# Patient Record
Sex: Female | Born: 1960 | Race: White | Hispanic: No | Marital: Married | Smoking: Former smoker
Health system: Southern US, Community
[De-identification: ages and names within clinical notes are randomized; demographics above are authoritative.]

## PROBLEM LIST (undated history)

## (undated) DIAGNOSIS — R519 Headache, unspecified: Secondary | ICD-10-CM

## (undated) DIAGNOSIS — R51 Headache: Secondary | ICD-10-CM

## (undated) HISTORY — DX: Headache, unspecified: R51.9

## (undated) HISTORY — DX: Headache: R51

---

## 2006-01-15 HISTORY — PX: TUBAL LIGATION: SHX77

## 2009-01-15 HISTORY — PX: THYROIDECTOMY, PARTIAL: SHX18

## 2012-05-21 ENCOUNTER — Encounter: Payer: Self-pay | Admitting: Sports Medicine

## 2012-05-21 ENCOUNTER — Ambulatory Visit (INDEPENDENT_AMBULATORY_CARE_PROVIDER_SITE_OTHER): Payer: BC Managed Care – PPO | Admitting: Sports Medicine

## 2012-05-21 VITALS — BP 110/76 | HR 82 | Ht 62.0 in | Wt 118.0 lb

## 2012-05-21 DIAGNOSIS — M25372 Other instability, left ankle: Secondary | ICD-10-CM

## 2012-05-21 DIAGNOSIS — M24873 Other specific joint derangements of unspecified ankle, not elsewhere classified: Secondary | ICD-10-CM
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
## 2013-07-13 ENCOUNTER — Encounter: Payer: Self-pay | Admitting: Sports Medicine

## 2013-07-13 ENCOUNTER — Ambulatory Visit (INDEPENDENT_AMBULATORY_CARE_PROVIDER_SITE_OTHER): Payer: BC Managed Care – PPO | Admitting: Sports Medicine

## 2013-07-13 VITALS — BP 124/80 | Ht 62.0 in | Wt 118.0 lb

## 2013-07-13 DIAGNOSIS — M7502 Adhesive capsulitis of left shoulder: Secondary | ICD-10-CM | POA: Insufficient documentation

## 2013-07-13 DIAGNOSIS — M7500 Adhesive capsulitis of unspecified shoulder: Secondary | ICD-10-CM

## 2013-07-13 MED ORDER — MELOXICAM 15 MG PO TABS: 15.0000 mg | ORAL_TABLET | Freq: Every day | ORAL | Status: DC

## 2013-07-13 NOTE — Assessment & Plan Note (Signed)
Patient with limited external rotation on exam consistent with adhesive capsulitis. We will send patient to physical therapy and place on course of Mobic (at least one week trial). She will return in 3 weeks for ultrasound of left shoulder. Patient also likely with rotator cuff tendinitis which will be helped by the above. She has no signs of significant tear and adhesive capsulitis is primary concern.

## 2013-07-13 NOTE — Progress Notes (Signed)
   Subjective:   Beverly Weeks is a 53 y.o. year old very pleasant female patient who presents with the following:  Left shoulder pain Pain x 4 months gradually worsening. Also describes a stiffness and inability to move arm such as behind her back. Wakes her up at night. Aching pain over lateral shoulder. Denies history of injury. Does not do a large amount of overhead work but when she does this it aggravates her. She has tried some gentle stretching but no medicines or ice at this point. States has difficulty with bra and has to ask husband for help.  ROS- No neck pain. No paresthesias. No radiation of pain. No fever/chills. No redness over shoulder.   Past Medical History-Nonsmoker, previously healthy, no kidney issues Medications- prn laxative   Objective: BP 124/80  Ht 5\' 2"  (1.575 m)  Wt 118 lb (53.524 kg)  BMI 21.58 kg/m2 Gen: NAD, resting comfortably on table  Ext: no edema Skin: warm, dry, no rash  Neuro: intact distal sensation  Left Shoulder: Inspection reveals no abnormalities, atrophy or asymmetry. Palpation is normal with no tenderness over AC joint or bicipital groove. ROM is full in all planes passively with exception of external rotation limited to 40 degrees as compared to 70 degrees on right. Actively patient can only get to 90 degrees abduction.  Rotator cuff strength normal throughout. Pain on empty can, Neer, and Hawkins.  Painful arc noted. No drop arm sign.    Assessment/Plan:  Adhesive capsulitis of left shoulder Patient with limited external rotation on exam consistent with adhesive capsulitis. We will send patient to physical therapy and place on course of Mobic (at least one week trial). She will return in 3 weeks for ultrasound of left shoulder. Patient also likely with rotator cuff tendinitis which will be helped by the above. She has no signs of significant tear and adhesive capsulitis is primary concern.    Meds ordered this encounter    Medications  . meloxicam (MOBIC) 15 MG tablet    Sig: Take 1 tablet (15 mg total) by mouth daily. For 1 week take daily with food, then as needed    Dispense:  30 tablet    Refill:  0    Generic.

## 2013-07-13 NOTE — Patient Instructions (Signed)
Adhesive Capsulitis Sometimes the shoulder becomes stiff and is painful to move. Some people say it feels as if the shoulder is frozen in place. Because of this, the condition is called "frozen shoulder." Its medical name is adhesive capsulitis.  The shoulder joint is made up of strong connective tissue that attaches the ball of the humerus to the shallow shoulder socket. This strong connective tissue is called the joint capsule. This tissue can become stiff and swollen. That is when adhesive capsulitis sets in. CAUSES  It is not always clear just what the cause adhesive capsulitis. Possibilities include:  Injury to the shoulder joint.  Strain. This is a repetitive injury brought about by overuse.  Lack of use. Perhaps your arm or hand was otherwise injured. It might have been in a sling for awhile. Or perhaps you were not using it to avoid pain.  Referred pain. This is a sort of trick the body plays. You feel pain in the shoulder. But, the pain actually comes from an injury somewhere else in the body.  Long-standing health problems. Several diseases can cause adhesive capsulitis. They include diabetes, heart disease, stroke, thyroid problems, rheumatoid arthritis and lung disease.  Being a women older than 53. Anyone can develop adhesive capsulitis but it is most common in women in this age group. SYMPTOMS   Pain.  It occurs when the arm is moved.  Parts of the shoulder might hurt if they are touched.  Pain is worse at night or when resting.  Soreness. It might not be strong enough to be called pain. But, the shoulder aches.  The shoulder does not move freely.  Muscle spasms.  Trouble sleeping because of shoulder ache or pain. DIAGNOSIS  To decide if you have adhesive capsulitis, your healthcare provider will probably:  Ask about symptoms you have noticed.  Ask about your history of joint pain and anything that might have caused the pain.  Ask about your overall  health.  Use hands to feel your shoulder and neck.  Ask you to move your shoulder in specific directions. This may indicate the origin of the pain.  Order imaging tests; pictures of the shoulder. They help pinpoint the source of the problem. An X-ray might be used. For more detail, an MRI is often used. An MRI details the tendons, muscles and ligaments as well as the joint. TREATMENT  Adhesive capsulitis can be treated several ways. Most treatments can be done in a clinic or in your healthcare provider's office. Be sure to discuss the different options with your caregiver. They include:  Physical therapy. You will work on specific exercises to get your shoulder moving again. The exercises usually involve stretching. A physical therapist (a caregiver with special training) can show you what to do and what not to do. The exercises will need to be done daily.  Medication.  Over-the-counter medicines may relieve pain and inflammation (the body's way of reacting to injury or infection).  Corticosteroids. These are stronger drugs to reduce pain and inflammation. They are given by injection (shots) into the shoulder joint. Frequent treatment is not recommended.  Muscle relaxants. Medication may be prescribed to ease muscle spasms.  Treatment of underlying conditions. This means treating another condition that is causing your shoulder problem. This might be a rotator cuff (tendon) problem  Shoulder manipulation. The shoulder will be moved by your healthcare provider. You would be under general anesthesia (given a drug that puts you to sleep). You would not feel anything. Sometimes  the joint will be injected with salt water (saline) at high pressure to break down internal scarring in the joint capsule.  Surgery. This is rarely needed. It may be suggested in advanced cases after all other treatment has failed. PROGNOSIS  In time, most people recover from adhesive capsulitis. Sometimes, however, the  pain goes away but full movement of the shoulder does not return.  HOME CARE INSTRUCTIONS   Take any pain medications recommended by your healthcare provider. Follow the directions carefully.  If you have physical therapy, follow through with the therapist's suggestions. Be sure you understand the exercises you will be doing. You should understand:  How often the exercises should be done.  How many times each exercise should be repeated.  How long they should be done.  What other activities you should do, or not do.  That you should warm up before doing any exercise. Just 5 to 10 minutes will help. Small, gentle movements should get your shoulder ready for more.  Avoid high-demand exercise that involves your shoulder such as throwing. This type of exercise can make pain worse.  Consider using cold packs. Cold may ease swelling and pain. Ask your healthcare provider if a cold pack might help you. If so, get directions on how and when to use them. SEEK MEDICAL CARE IF:   You have any questions about your medications.  Your pain continues to increase. Document Released: 10/29/2008 Document Revised: 03/26/2011 Document Reviewed: 10/29/2008 [facility] Patient Information 2015 [place], [place]. This information is not intended to replace advice given to you by your health care provider. Make sure you discuss any questions you have with your health care provider.  Rotator Cuff Tendinitis  Rotator cuff tendinitis is inflammation of the tough, cord-like bands that connect muscle to bone (tendons) in your rotator cuff. Your rotator cuff is the collection of all the muscles and tendons that connect your arm to your shoulder. Your rotator cuff holds the head of your upper arm bone (humerus) in the cup (fossa) of your shoulder blade (scapula). CAUSES Rotator cuff tendinitis is usually caused by overusing the joint involved.  SIGNS AND SYMPTOMS  Deep ache in the shoulder also felt on the outside  upper arm over the shoulder muscle.  Point tenderness over the area that is injured.  Pain comes on gradually and becomes worse with lifting the arm to the side (abduction) or turning it inward (internal rotation).  May lead to a chronic tear: When a rotator cuff tendon becomes inflamed, it runs the risk of losing its blood supply, causing some tendon fibers to die. This increases the risk that the tendon can fray and partially or completely tear. DIAGNOSIS Rotator cuff tendinitis is diagnosed by taking a medical history, performing a physical exam, and reviewing results of imaging exams. The medical history is useful to help determine the type of rotator cuff injury. The physical exam will include looking at the injured shoulder, feeling the injured area, and watching you do range-of-motion exercises. X-ray exams are typically done to rule out other causes of shoulder pain, such as fractures. MRI is the imaging exam usually used for significant shoulder injuries. Sometimes a dye study called CT arthrogram is done, but it is not as widely used as MRI. In some institutions, special ultrasound tests may also be used to aid in the diagnosis. TREATMENT  Less Severe Cases  Use of a sling to rest the shoulder for a short period of time. Prolonged use of the sling can  cause stiffness, weakness, and loss of motion of the shoulder joint.  Anti-inflammatory medicines, such as ibuprofen or naproxen sodium, may be prescribed. More Severe Cases  Physical therapy.  Use of steroid injections into the shoulder joint.  Surgery. HOME CARE INSTRUCTIONS   Use a sling or splint until the pain decreases. Prolonged use of the sling can cause stiffness, weakness, and loss of motion of the shoulder joint.  Apply ice to the injured area:  Put ice in a plastic bag.  Place a towel between your skin and the bag.  Leave the ice on for 20 minutes, 2-3 times a day.  Try to avoid use other than gentle range of  motion while your shoulder is painful. Use the shoulder and exercise only as directed by your health care provider. Stop exercises or range of motion if pain or discomfort increases, unless directed otherwise by your health care provider.  Only take over-the-counter or prescription medicines for pain, discomfort, or fever as directed by your health care provider.  If you were given a shoulder sling and straps (immobilizer), do not remove it except as directed, or until you see a health care provider for a follow-up exam. If you need to remove it, move your arm as little as possible or as directed.  You may want to sleep on several pillows at night to lessen swelling and pain. SEEK IMMEDIATE MEDICAL CARE IF:   Your shoulder pain increases or new pain develops in your arm, hand, or fingers and is not relieved with medicines.  You have new, unexplained symptoms, especially increased numbness in the hands or loss of strength.  You develop any worsening of the problems that brought you in for care.  Your arm, hand, or fingers are numb or tingling.  Your arm, hand, or fingers are swollen, painful, or turn white or blue. MAKE SURE YOU:  Understand these instructions.  Will watch your condition.  Will get help right away if you are not doing well or get worse. Document Released: 03/24/2003 Document Revised: 10/22/2012 Document Reviewed: 08/13/2012 [facility]ExitCare Patient Information 2015 [place]Care, [place]. This information is not intended to replace advice given to you by your health care provider. Make sure you discuss any questions you have with your health care provider.

## 2013-08-03 ENCOUNTER — Other Ambulatory Visit: Payer: BC Managed Care – PPO | Admitting: Sports Medicine

## 2013-08-09 ENCOUNTER — Other Ambulatory Visit: Payer: Self-pay | Admitting: Family Medicine

## 2013-12-25 ENCOUNTER — Other Ambulatory Visit: Payer: Self-pay | Admitting: Orthopaedic Surgery

## 2013-12-25 DIAGNOSIS — M47896 Other spondylosis, lumbar region: Secondary | ICD-10-CM

## 2013-12-26 ENCOUNTER — Ambulatory Visit
Admission: RE | Admit: 2013-12-26 | Discharge: 2013-12-26 | Disposition: A | Discharge status: Home / Self Care | Payer: Self-pay | Source: Ambulatory Visit | Attending: Orthopaedic Surgery | Admitting: Orthopaedic Surgery

## 2013-12-26 DIAGNOSIS — M47896 Other spondylosis, lumbar region: Secondary | ICD-10-CM

## 2014-12-22 ENCOUNTER — Ambulatory Visit: Payer: Self-pay | Admitting: Sports Medicine

## 2015-03-04 LAB — BASIC METABOLIC PANEL
BUN: 13 mg/dL (ref 4–21)
CREATININE: 0.8 mg/dL (ref 0.5–1.1)
GLUCOSE: 79 mg/dL
POTASSIUM: 4.1 mmol/L (ref 3.4–5.3)
SODIUM: 142 mmol/L (ref 137–147)

## 2015-03-04 LAB — HEPATIC FUNCTION PANEL
ALT: 19 U/L (ref 7–35)
AST: 23 U/L (ref 13–35)
BILIRUBIN, TOTAL: 0.4 mg/dL

## 2015-03-04 LAB — LIPID PANEL
Cholesterol: 222 mg/dL — AB (ref 0–200)
HDL: 65 mg/dL (ref 35–70)
LDL CALC: 138 mg/dL
TRIGLYCERIDES: 96 mg/dL (ref 40–160)

## 2015-03-04 LAB — CBC AND DIFFERENTIAL
HEMATOCRIT: 40 % (ref 36–46)
Hemoglobin: 13.4 g/dL (ref 12.0–16.0)
Platelets: 237 10*3/uL (ref 150–399)
WBC: 5.8 10^3/mL

## 2015-03-04 LAB — TSH: TSH: 4.26 u[IU]/mL (ref 0.41–5.90)

## 2015-03-08 ENCOUNTER — Other Ambulatory Visit: Payer: Self-pay | Admitting: Family Medicine

## 2015-03-08 DIAGNOSIS — Z1231 Encounter for screening mammogram for malignant neoplasm of breast: Secondary | ICD-10-CM

## 2015-03-22 ENCOUNTER — Other Ambulatory Visit: Payer: Self-pay | Admitting: Family Medicine

## 2015-03-22 DIAGNOSIS — Z9882 Breast implant status: Secondary | ICD-10-CM

## 2015-03-22 DIAGNOSIS — Z1231 Encounter for screening mammogram for malignant neoplasm of breast: Secondary | ICD-10-CM

## 2015-04-06 ENCOUNTER — Other Ambulatory Visit: Payer: Self-pay | Admitting: Family Medicine

## 2015-04-06 ENCOUNTER — Ambulatory Visit
Admission: RE | Admit: 2015-04-06 | Discharge: 2015-04-06 | Disposition: A | Discharge status: Home / Self Care | Payer: BLUE CROSS/BLUE SHIELD | Source: Ambulatory Visit | Attending: Family Medicine | Admitting: Family Medicine

## 2015-04-06 DIAGNOSIS — Z9882 Breast implant status: Secondary | ICD-10-CM

## 2015-04-06 DIAGNOSIS — Z1231 Encounter for screening mammogram for malignant neoplasm of breast: Secondary | ICD-10-CM

## 2015-04-24 ENCOUNTER — Emergency Department (HOSPITAL_COMMUNITY)
Admission: EM | Admit: 2015-04-24 | Discharge: 2015-04-24 | Disposition: A | Discharge status: Home / Self Care | Payer: BLUE CROSS/BLUE SHIELD | Attending: Emergency Medicine | Admitting: Emergency Medicine

## 2015-04-24 ENCOUNTER — Encounter (HOSPITAL_COMMUNITY): Payer: Self-pay | Admitting: Emergency Medicine

## 2015-04-24 DIAGNOSIS — R002 Palpitations: Secondary | ICD-10-CM | POA: Insufficient documentation

## 2015-04-24 DIAGNOSIS — F419 Anxiety disorder, unspecified: Secondary | ICD-10-CM | POA: Insufficient documentation

## 2015-04-24 DIAGNOSIS — Z87891 Personal history of nicotine dependence: Secondary | ICD-10-CM | POA: Insufficient documentation

## 2015-04-24 LAB — TSH: TSH: 4.014 u[IU]/mL (ref 0.350–4.500)

## 2015-04-24 LAB — CBC WITH DIFFERENTIAL/PLATELET
BASOS PCT: 0 %
Basophils Absolute: 0 10*3/uL (ref 0.0–0.1)
Eosinophils Absolute: 0.4 10*3/uL (ref 0.0–0.7)
Eosinophils Relative: 5 %
HEMATOCRIT: 37.4 % (ref 36.0–46.0)
Hemoglobin: 12.5 g/dL (ref 12.0–15.0)
LYMPHS ABS: 2.4 10*3/uL (ref 0.7–4.0)
Lymphocytes Relative: 35 %
MCH: 28.9 pg (ref 26.0–34.0)
MCHC: 33.4 g/dL (ref 30.0–36.0)
MCV: 86.4 fL (ref 78.0–100.0)
MONO ABS: 0.4 10*3/uL (ref 0.1–1.0)
MONOS PCT: 5 %
NEUTROS ABS: 3.7 10*3/uL (ref 1.7–7.7)
Neutrophils Relative %: 55 %
Platelets: 266 10*3/uL (ref 150–400)
RBC: 4.33 MIL/uL (ref 3.87–5.11)
RDW: 13 % (ref 11.5–15.5)
WBC: 6.9 10*3/uL (ref 4.0–10.5)

## 2015-04-24 LAB — BASIC METABOLIC PANEL
Anion gap: 8 (ref 5–15)
BUN: 12 mg/dL (ref 6–20)
CALCIUM: 9.6 mg/dL (ref 8.9–10.3)
CO2: 27 mmol/L (ref 22–32)
CREATININE: 0.65 mg/dL (ref 0.44–1.00)
Chloride: 109 mmol/L (ref 101–111)
GFR calc non Af Amer: >60 mL/min (ref >60)
GLUCOSE: 97 mg/dL (ref 65–99)
Potassium: 4 mmol/L (ref 3.5–5.1)
Sodium: 144 mmol/L (ref 135–145)

## 2015-04-24 NOTE — ED Notes (Signed)
Pt states that she took a Claritin D at approx 4 pm and started having heart palpitations and sob. Alert and oriented.

## 2015-04-24 NOTE — ED Provider Notes (Signed)
CSN: [id number]     Arrival date & time 04/24/15  2020 History   First MD Initiated Contact with Patient 04/24/15 2040     Chief Complaint  Patient presents with  . Palpitations  . Medication Reaction     (Consider location/radiation/quality/duration/timing/severity/associated sxs/prior Treatment) Patient is a 55 y.o. female presenting with palpitations. The history is provided by the patient.  Palpitations Palpitations quality:  Irregular Onset quality:  Sudden Duration:  2 hours Timing:  Constant Chronicity:  New Relieved by:  Nothing Worsened by:  Nothing Ineffective treatments:  None tried Associated symptoms: no chest pain, no dizziness, no nausea, no shortness of breath and no vomiting     55 yo F with a cc of palpations.  Has been feeling off for past couple days.  Feeling like she has allergies, took a benadryl last night, then a claritin D this afternoon.  Felt like her heart rate was fast and irregular.  Some anxiety with this as well.  Denies fevers, chills.   History reviewed. No pertinent past medical history. Past Surgical History  Procedure Laterality Date  . Thyroidectomy, partial Left    History reviewed. No pertinent family history. Social History  Substance Use Topics  . Smoking status: Former Smoker    Quit date: 01/16/1988  . Smokeless tobacco: Never Used  . Alcohol Use: Yes   OB History    No data available     Review of Systems  Constitutional: Negative for fever and chills.  HENT: Negative for congestion and rhinorrhea.   Eyes: Negative for redness and visual disturbance.  Respiratory: Negative for shortness of breath and wheezing.   Cardiovascular: Positive for palpitations. Negative for chest pain.  Gastrointestinal: Negative for nausea and vomiting.  Genitourinary: Negative for dysuria and urgency.  Musculoskeletal: Negative for myalgias and arthralgias.  Skin: Negative for pallor and wound.  Neurological: Negative for dizziness and  headaches.  Psychiatric/Behavioral: The patient is nervous/anxious.       Allergies  Review of patient's allergies indicates no known allergies.  Home Medications   Prior to Admission medications   Medication Sig Start Date End Date Taking? Authorizing Provider  diphenhydrAMINE (SOMINEX) 25 MG tablet Take 25 mg by mouth at bedtime as needed for allergies or sleep.   Yes Historical Provider, MD  ibuprofen (ADVIL,MOTRIN) 200 MG tablet Take 400 mg by mouth every 6 (six) hours as needed for headache, mild pain or moderate pain.   Yes Historical Provider, MD  loratadine-pseudoephedrine (CLARITIN-D 24-HOUR) 10-240 MG 24 hr tablet Take 1 tablet by mouth once.   Yes Historical Provider, MD  meloxicam (MOBIC) 15 MG tablet TAKE 1 TABLET BY MOUTH EVERY DAY, FOR 1 WEEK DAILY WITH FOOD THEN AS NEEDED Patient not taking: Reported on 04/24/2015    Ozzie Hoyle Draper, DO   BP 116/71 mmHg  Pulse 81  Temp(Src) 97.7 F (36.5 C) (Oral)  Resp 15  Ht  (1.575 m)  Wt 123 lb (55.792 kg)  BMI 22.49 kg/m2  SpO2 98% Physical Exam  Constitutional: She is oriented to person, place, and time. She appears well-developed and well-nourished. No distress.  HENT:  Head: Normocephalic and atraumatic.  Eyes: EOM are normal. Pupils are equal, round, and reactive to light.  Neck: Normal range of motion. Neck supple.  Cardiovascular: Normal rate and regular rhythm.  Exam reveals no gallop and no friction rub.   No murmur heard. Pulmonary/Chest: Effort normal. She has no wheezes. She has no rales.  Abdominal: Soft. She  exhibits no distension. There is no tenderness.  Musculoskeletal: She exhibits no edema or tenderness.  Neurological: She is alert and oriented to person, place, and time.  Skin: Skin is warm and dry. She is not diaphoretic.  Psychiatric: She has a normal mood and affect. Her behavior is normal.  Nursing note and vitals reviewed.   ED Course  Procedures (including critical care time) Labs  Review Labs Reviewed  CBC WITH DIFFERENTIAL/PLATELET  BASIC METABOLIC PANEL  TSH  T4    Imaging Review No results found. I have personally reviewed and evaluated these images and lab results as part of my medical decision-making.   EKG Interpretation   Date/Time:  Sunday April 24 2015 20:37:23 EDT Ventricular Rate:  89 PR Interval:  158 QRS Duration: 87 QT Interval:  386 QTC Calculation: 470 R Axis:   28 Text Interpretation:  Sinus rhythm Low voltage, precordial leads No old  tracing to compare Confirmed by Moo Gravley MD, DANIEL [phone]([id number]) on 04/24/2015  9:47:06 PM      MDM   Final diagnoses:  Heart palpitations    55 yo F with palpitations.  ECG with normal rhythm, not tachycardia.  Discussed with family, will obtain lytes, cbc, tsh, free t4. Observe on cardiac monitor.   No noted arrhythmias on the cardiac monitor. Electronically unremarkable. We'll discharge the patient home. Have her follow-up with her family doctor.  10:39 PM:  I have discussed the diagnosis/risks/treatment options with the patient and family and believe the pt to be eligible for discharge home to follow-up with PCP. We also discussed returning to the ED immediately if new or worsening sx occur. We discussed the sx which are most concerning (e.g., sudden worsening sob, chest pain, syncope.  Worsening palpitations) that necessitate immediate return. Medications administered to the patient during their visit and any new prescriptions provided to the patient are listed below.  Medications given during this visit Medications - No data to display  New Prescriptions   No medications on file    The patient appears reasonably screen and/or stabilized for discharge and I doubt any other medical condition or other [facility] requiring further screening, evaluation, or treatment in the ED at this time prior to discharge.     Melene Planan Anhthu Perdew, DO 04/24/15 2240

## 2015-04-24 NOTE — Discharge Instructions (Signed)
Holter Monitoring A Holter monitor is a small device that is used to detect abnormal heart rhythms. It clips to your clothing and is connected by wires to flat, sticky disks (electrodes) that attach to your chest. It is worn continuously for 24-48 hours. HOME CARE INSTRUCTIONS  Wear your Holter monitor at all times, even while exercising and sleeping, for as long as directed by your health care provider.  Make sure that the Holter monitor is safely clipped to your clothing or close to your body as recommended by your health care provider.  Do not get the monitor or wires wet.  Do not put body lotion or moisturizer on your chest.  Keep your skin clean.  Keep a diary of your daily activities, such as walking and doing chores. If you feel that your heartbeat is abnormal or that your heart is fluttering or skipping a beat:  Record what you are doing when it happens.  Record what time of day the symptoms occur.  Return your Holter monitor as directed by your health care provider.  Keep all follow-up visits as directed by your health care provider. This is important. SEEK IMMEDIATE MEDICAL CARE IF:  You feel lightheaded or you faint.  You have trouble breathing.  You feel pain in your chest, upper arm, or jaw.  You feel sick to your stomach and your skin is pale, cool, or damp.  You heartbeat feels unusual or abnormal.   This information is not intended to replace advice given to you by your health care provider. Make sure you discuss any questions you have with your health care provider.   Document Released: 09/30/2003 Document Revised: 01/22/2014 Document Reviewed: 08/10/2013 Elsevier Interactive Patient Education 2016 [employer]  

## 2015-04-26 LAB — T4: T4, Total: 5.6 ug/dL (ref 4.5–12.0)

## 2015-06-29 DIAGNOSIS — S63501A Unspecified sprain of right wrist, initial encounter: Secondary | ICD-10-CM | POA: Diagnosis not present

## 2015-06-29 DIAGNOSIS — M25531 Pain in right wrist: Secondary | ICD-10-CM | POA: Diagnosis not present

## 2015-07-08 DIAGNOSIS — H524 Presbyopia: Secondary | ICD-10-CM | POA: Diagnosis not present

## 2015-07-08 DIAGNOSIS — H52223 Regular astigmatism, bilateral: Secondary | ICD-10-CM | POA: Diagnosis not present

## 2015-08-10 ENCOUNTER — Encounter: Payer: Self-pay | Admitting: Family Medicine

## 2015-08-10 ENCOUNTER — Ambulatory Visit (HOSPITAL_BASED_OUTPATIENT_CLINIC_OR_DEPARTMENT_OTHER)
Admission: RE | Admit: 2015-08-10 | Discharge: 2015-08-10 | Disposition: A | Discharge status: Home / Self Care | Payer: BLUE CROSS/BLUE SHIELD | Source: Ambulatory Visit | Attending: Family Medicine | Admitting: Family Medicine

## 2015-08-10 ENCOUNTER — Ambulatory Visit (INDEPENDENT_AMBULATORY_CARE_PROVIDER_SITE_OTHER): Payer: BLUE CROSS/BLUE SHIELD | Admitting: Family Medicine

## 2015-08-10 VITALS — BP 118/77 | HR 91 | Ht 62.0 in | Wt 130.0 lb

## 2015-08-10 DIAGNOSIS — M25572 Pain in left ankle and joints of left foot: Secondary | ICD-10-CM | POA: Diagnosis not present

## 2015-08-10 DIAGNOSIS — S99912A Unspecified injury of left ankle, initial encounter: Secondary | ICD-10-CM | POA: Insufficient documentation

## 2015-08-10 DIAGNOSIS — S29009A Unspecified injury of muscle and tendon of unspecified wall of thorax, initial encounter: Secondary | ICD-10-CM | POA: Diagnosis not present

## 2015-08-10 DIAGNOSIS — X58XXXA Exposure to other specified factors, initial encounter: Secondary | ICD-10-CM | POA: Insufficient documentation

## 2015-08-10 DIAGNOSIS — S29019A Strain of muscle and tendon of unspecified wall of thorax, initial encounter: Secondary | ICD-10-CM

## 2015-08-10 NOTE — Patient Instructions (Addendum)
You have an strained your posterior tibialis tendon. Ice the area for 15 minutes at a time, 3-4 times a day Aleve 2 tabs twice a day with food OR ibuprofen 3 tabs three times a day with food for pain and inflammation for 7-10 days then as needed. Elevate above the level of your heart when possible if needed for swelling. Crutches if needed to help with walking Bear weight when tolerated Arch supports (like dr. Jari Sportsman active series or something similar) and avoiding flat shoes/barefoot walking are important for this to help rest it. Start theraband strengthening exercises when tolerated - once a day 3 sets of 10. Consider physical therapy. Follow up with me in 2 weeks for reevaluation.  You have a thoracic strain. Aleve or ibuprofen as noted above.   Call us if you have worsening pain, difficulty breathing (unlikely). Heat may help more for this area. Consider physical therapy, massage.

## 2015-08-15 DIAGNOSIS — S29019A Strain of muscle and tendon of unspecified wall of thorax, initial encounter: Secondary | ICD-10-CM | POA: Insufficient documentation

## 2015-08-15 DIAGNOSIS — S99912A Unspecified injury of left ankle, initial encounter: Secondary | ICD-10-CM | POA: Insufficient documentation

## 2015-08-15 NOTE — Assessment & Plan Note (Signed)
2/2 posterior tibialis tendon strain.  Icing, arch supports.  Avoid flat shoes and barefoot walking.  Shown home exercises to do daily.  Regular nsaids for 7-10 days then as needed.  F/u in 2 weeks.

## 2015-08-15 NOTE — Assessment & Plan Note (Signed)
patient declined radiographs today - likely strain with contusion.  She will use heat, nsaids.

## 2015-08-15 NOTE — Progress Notes (Signed)
PCP: Betty Swaziland, MD  Subjective:   HPI: Patient is a 55 y.o. female here for left ankle injury.  Patient reports on 7/25 she was on a stepstool when she was coming down and missed the bottom step, fell down. Pain medial left ankle, swelling slightly. No prior injuries. Pain level is 5/10, worse with ambulation. Has not had imaging of this. Pain is sharp. No skin changes, numbness. Also with some pain right mid-back from where she landed on this side. No shortness of breath, cough. No other complaints.  No past medical history on file.  Current Outpatient Prescriptions on File Prior to Visit  Medication Sig Dispense Refill  . diphenhydrAMINE (SOMINEX) 25 MG tablet Take 25 mg by mouth at bedtime as needed for allergies or sleep.    Marland Kitchen ibuprofen (ADVIL,MOTRIN) 200 MG tablet Take 400 mg by mouth every 6 (six) hours as needed for headache, mild pain or moderate pain.    Marland Kitchen loratadine-pseudoephedrine (CLARITIN-D 24-HOUR) 10-240 MG 24 hr tablet Take 1 tablet by mouth once.    . meloxicam (MOBIC) 15 MG tablet TAKE 1 TABLET BY MOUTH EVERY DAY, FOR 1 WEEK DAILY WITH FOOD THEN AS NEEDED (Patient not taking: Reported on 04/24/2015) 30 tablet 0   No current facility-administered medications on file prior to visit.     Past Surgical History:  Procedure Laterality Date  . THYROIDECTOMY, PARTIAL Left     No Known Allergies  Social History   Social History  . Marital status: Single    Spouse name: N/A  . Number of children: N/A  . Years of education: N/A   Occupational History  . Not on file.   Social History Main Topics  . Smoking status: Former Smoker    Quit date: 01/16/1988  . Smokeless tobacco: Never Used  . Alcohol use Yes  . Drug use: No  . Sexual activity: Not on file   Other Topics Concern  . Not on file   Social History Narrative  . No narrative on file    No family history on file.  BP 118/77   Pulse 91   Ht 5\' 2"  (1.575 m)   Wt 130 lb (59 kg)   LMP  10/26/2013 (Approximate)   BMI 23.78 kg/m   Review of Systems: See HPI above.    Objective:  Physical Exam:  Gen: NAD, comfortable in exam room  Left ankle: No gross deformity, swelling, ecchymoses FROM with pain on internal and external rotation. TTP over posterior tibialis tendon.  Less TTP medial malleolus.  No other tenderness including fibular head. Negative ant drawer and talar tilt, reverse talar tilt.   Negative syndesmotic compression. Thompsons test negative. NV intact distally.  Right ankle: FROM without pain.  MSK u/s:  Thickening of posterior tibialis tendon left ankle. Small target sign as well.  Increased neovascularity within tendon.  No other abnormalities.    Assessment & Plan:  1. Left ankle injury - 2/2 posterior tibialis tendon strain.  Icing, arch supports.  Avoid flat shoes and barefoot walking.  Shown home exercises to do daily.  Regular nsaids for 7-10 days then as needed.  F/u in 2 weeks.    2. Thoracic strain - patient declined radiographs today - likely strain with contusion.  She will use heat, nsaids.

## 2015-09-13 DIAGNOSIS — M545 Low back pain: Secondary | ICD-10-CM | POA: Diagnosis not present

## 2015-09-14 DIAGNOSIS — M545 Low back pain: Secondary | ICD-10-CM | POA: Diagnosis not present

## 2015-09-21 DIAGNOSIS — M545 Low back pain: Secondary | ICD-10-CM | POA: Diagnosis not present

## 2015-10-04 DIAGNOSIS — M545 Low back pain: Secondary | ICD-10-CM | POA: Diagnosis not present

## 2015-10-06 DIAGNOSIS — M545 Low back pain: Secondary | ICD-10-CM | POA: Diagnosis not present

## 2015-10-11 DIAGNOSIS — M545 Low back pain: Secondary | ICD-10-CM | POA: Diagnosis not present

## 2015-10-13 DIAGNOSIS — M545 Low back pain: Secondary | ICD-10-CM | POA: Diagnosis not present

## 2015-10-26 DIAGNOSIS — M545 Low back pain: Secondary | ICD-10-CM | POA: Diagnosis not present

## 2016-01-05 DIAGNOSIS — H00025 Hordeolum internum left lower eyelid: Secondary | ICD-10-CM | POA: Diagnosis not present

## 2016-01-18 ENCOUNTER — Encounter: Payer: Self-pay | Admitting: Family Medicine

## 2016-01-18 ENCOUNTER — Ambulatory Visit (INDEPENDENT_AMBULATORY_CARE_PROVIDER_SITE_OTHER): Payer: BLUE CROSS/BLUE SHIELD | Admitting: Family Medicine

## 2016-01-18 VITALS — BP 110/80 | HR 88 | Resp 12 | Ht 62.0 in | Wt 136.1 lb

## 2016-01-18 DIAGNOSIS — Z1159 Encounter for screening for other viral diseases: Secondary | ICD-10-CM

## 2016-01-18 DIAGNOSIS — Z0000 Encounter for general adult medical examination without abnormal findings: Secondary | ICD-10-CM

## 2016-01-18 DIAGNOSIS — Z131 Encounter for screening for diabetes mellitus: Secondary | ICD-10-CM

## 2016-01-18 DIAGNOSIS — Z13220 Encounter for screening for lipoid disorders: Secondary | ICD-10-CM | POA: Diagnosis not present

## 2016-01-18 LAB — BASIC METABOLIC PANEL
BUN: 13 mg/dL (ref 6–23)
CHLORIDE: 104 meq/L (ref 96–112)
CO2: 32 meq/L (ref 19–32)
Calcium: 9.6 mg/dL (ref 8.4–10.5)
Creatinine, Ser: 0.76 mg/dL (ref 0.40–1.20)
GFR: 84.17 mL/min (ref >60.00)
GLUCOSE: 77 mg/dL (ref 70–99)
Potassium: 4.4 mEq/L (ref 3.5–5.1)
Sodium: 143 mEq/L (ref 135–145)

## 2016-01-18 LAB — LIPID PANEL
CHOLESTEROL: 257 mg/dL — AB (ref 0–200)
HDL: 62.5 mg/dL (ref >39.00)
LDL Cholesterol: 164 mg/dL — ABNORMAL HIGH (ref 0–99)
NONHDL: 194.34
TRIGLYCERIDES: 151 mg/dL — AB (ref 0.0–149.0)
Total CHOL/HDL Ratio: 4
VLDL: 30.2 mg/dL (ref 0.0–40.0)

## 2016-01-18 LAB — TSH: TSH: 2.85 u[IU]/mL (ref 0.35–4.50)

## 2016-01-18 NOTE — Progress Notes (Signed)
HPI:   [name] is a 56 y.o. female, who is here today for her routine physical.  I last saw her about a year ago at [facility], [place]  She has not exercised regularly for about 2 months and does follow a healthy diet.  She lives with husband.  Chronic medical problems: Healthy otherwise. S/P partial thyroidectomy, she would like TSH check. She has noted wt gain , which she attributes to not exercising regularly for the past couple months.   Pap smear 2016, negative for malignancy and negative HPV. Hx of abnormal pap smears: About 8 years ago,+ HPV, reporting negative colposcopy.   Mammogram 04/2014 Bir[facility] 1 Colonoscopy 2015, per pt report.   FHx for gynecologic or colon cancer negative.  She has no concerns today.   Hep C screening: never, denies risk factors.    Review of Systems  Constitutional: Negative for appetite change, fatigue and fever.  HENT: Negative for dental problem, hearing loss, nosebleeds, trouble swallowing and voice change.   Eyes: Negative for photophobia, pain and visual disturbance.  Respiratory: Negative for cough, shortness of breath and wheezing.   Cardiovascular: Negative for chest pain, palpitations and leg swelling.  Gastrointestinal: Negative for abdominal pain, blood in stool, nausea and vomiting.       No changes in bowel habits.  Endocrine: Negative for cold intolerance, heat intolerance, polydipsia, polyphagia and polyuria.  Genitourinary: Negative for decreased urine volume, dysuria, hematuria, vaginal bleeding and vaginal discharge.       No breast tenderness or nipple discharge.  Musculoskeletal: Negative for gait problem and myalgias.  Skin: Negative for color change and rash.  Neurological: Negative for syncope, weakness, numbness and headaches.  Hematological: Negative for adenopathy. Does not bruise/bleed easily.  Psychiatric/Behavioral: Negative for confusion and sleep disturbance. The patient is not  nervous/anxious.   All other systems reviewed and are negative.     No current outpatient prescriptions on file prior to visit.   No current facility-administered medications on file prior to visit.      Past Medical History:  Diagnosis Date  . Headache     No Known Allergies  Family History  Problem Relation Age of Onset  . Diabetes Maternal Aunt   . Diabetes Maternal Grandmother     Social History   Social History  . Marital status: Single    Spouse name: N/A  . Number of children: N/A  . Years of education: N/A   Social History Main Topics  . Smoking status: Former Smoker    Quit date: 01/16/1988  . Smokeless tobacco: Never Used  . Alcohol use Yes     Comment: occasionally   . Drug use: No  . Sexual activity: Yes    Birth control/ protection: Post-menopausal, Surgical   Other Topics Concern  . None   Social History Narrative  . None     Vitals:   01/18/16 0849  BP: 110/80  Pulse: 88  Resp: 12   Body mass index is 24.9 kg/m.  O2 sat at RA 98%  Wt Readings from Last 3 Encounters:  01/18/16 136 lb 2 oz (61.7 kg)  08/10/15 130 lb (59 kg)  04/24/15 123 lb (55.8 kg)       Physical Exam  Nursing note and vitals reviewed. Constitutional: She is oriented to person, place, and time. She appears well-developed and well-nourished. No distress.  HENT:  Head: Atraumatic.  Right Ear: Hearing, tympanic membrane, external ear and ear canal normal.  Left Ear: Hearing, tympanic membrane, external ear and ear canal normal.  Mouth/Throat: Uvula is midline, oropharynx is clear and moist and mucous membranes are normal.  Eyes: Conjunctivae and EOM are normal. Pupils are equal, round, and reactive to light.  Neck: No tracheal deviation present. No thyroid mass and no thyromegaly present.  Cardiovascular: Normal rate and regular rhythm.   No murmur heard. Pulses:      Dorsalis pedis pulses are 2+ on the right side, and 2+ on the left side.       Posterior  tibial pulses are 2+ on the right side, and 2+ on the left side.  Respiratory: Effort normal and breath sounds normal. No respiratory distress.  GI: Soft. She exhibits no mass. There is no hepatomegaly. There is no tenderness.  Genitourinary: No breast swelling or tenderness.  Genitourinary Comments: Breast with no masses or nipple discharge bilateral.   Musculoskeletal: She exhibits no edema or tenderness.  No major deformity or signs of synovitis appreciated.  Lymphadenopathy:    She has no cervical adenopathy.    She has no axillary adenopathy.       Right: No supraclavicular adenopathy present.       Left: No supraclavicular adenopathy present.  Neurological: She is alert and oriented to person, place, and time. She has normal strength. No cranial nerve deficit. Coordination and gait normal.  Reflex Scores:      Bicep reflexes are 2+ on the right side and 2+ on the left side.      Patellar reflexes are 2+ on the right side and 2+ on the left side. Skin: Skin is warm. No rash noted. No erythema.  Psychiatric: She has a normal mood and affect. Her speech is normal. Cognition and memory are normal.  Well groomed, good eye contact.      ASSESSMENT AND PLAN:      Beverly Weeks was seen today for establish care.  Diagnoses and all orders for this visit:  Lab Results  Component Value Date   CHOL 257 (H) 01/18/2016   HDL 62.50 01/18/2016   LDLCALC 164 (H) 01/18/2016   TRIG 151.0 (H) 01/18/2016   CHOLHDL 4 01/18/2016   Lab Results  Component Value Date   TSH 2.85 01/18/2016   Lab Results  Component Value Date   CREATININE 0.76 01/18/2016   BUN 13 01/18/2016   NA 143 01/18/2016   K 4.4 01/18/2016   CL 104 01/18/2016   CO2 32 01/18/2016    Routine physical examination   We discussed the importance of regular physical activity and healthy diet for prevention of chronic illness and/or complications. Preventive guidelines reviewed. Vaccination up to date except for Influenza  vaccine (refused).  Ca++ and vit D supplementation recommended. Next CPE in 1-2 years.   -     Lipid panel -     Basic metabolic panel -     TSH  Lipid screening -     Lipid panel  Diabetes mellitus screening -     Basic metabolic panel  Encounter for hepatitis C screening test for low risk patient -     Hepatitis C Antibody      Return in about 1 year (around 01/17/2017) for routine.       Aries Townley G. SwazilandJordan, MD  [facility]. [place] office.

## 2016-01-18 NOTE — Patient Instructions (Signed)
A few things to remember from today's visit:   Routine physical examination - Plan: Lipid panel, Basic metabolic panel, TSH  Lipid screening - Plan: Lipid panel  Diabetes mellitus screening - Plan: Basic metabolic panel  Encounter for hepatitis C screening test for low risk patient - Plan: Hepatitis C Antibody   At least 150 minutes of moderate exercise per week, daily brisk walking for 15-30 min is a good exercise option. Healthy diet low in saturated (animal) fats and sweets and consisting of fresh fruits and vegetables, lean meats such as fish and white chicken and whole grains.   - Vaccines:  Tdap vaccine every 10 years. Due 2027.  Shingles vaccine recommended at age 55, could be given after 55 years of age but not sure about insurance coverage.  Pneumonia vaccines:  Prevnar 13 at 65 and Pneumovax at 66.  Screening recommendations for low/normal risk women:  Screening for diabetes at age 55-45 and every 3 years.  Cervical cancer prevention:  -HPV vaccination between 239-55 years old. -Pap smear starts at 55 years of age and continues periodically until 55 years old in low risk women. Pap smear every 3 years between 6521 and 55 years old. Pap smear every 3 years between women 30 and older if pap smear negative and HPV screening negative.   -Breast cancer: Mammogram: There is disagreement between experts about when to start screening in low risk asymptomatic female but recent recommendations are to start screening at 4440 and not later than 55 years old , every 1-2 years and after 55 yo q 2 years. Screening is recommended until 55 years old but some women can continue screening depending of healthy issues.   Colon cancer screening: starts at 10940 years old until 55 years old.  Cholesterol disorder screening at age 55 and every 3 years.     Please be sure medication list is accurate. If a new problem present, please set up appointment sooner than planned  today.

## 2016-01-18 NOTE — Progress Notes (Signed)
Pre visit review using our clinic review tool, if applicable. No additional management support is needed unless otherwise documented below in the visit note. 

## 2016-01-19 ENCOUNTER — Encounter: Payer: Self-pay | Admitting: Family Medicine

## 2016-01-19 LAB — HEPATITIS C ANTIBODY: HCV Ab: NEGATIVE

## 2016-05-04 ENCOUNTER — Other Ambulatory Visit: Payer: Self-pay | Admitting: Gastroenterology

## 2016-05-04 DIAGNOSIS — R195 Other fecal abnormalities: Secondary | ICD-10-CM | POA: Diagnosis not present

## 2016-05-04 DIAGNOSIS — R1011 Right upper quadrant pain: Secondary | ICD-10-CM | POA: Diagnosis not present

## 2016-05-04 DIAGNOSIS — R140 Abdominal distension (gaseous): Secondary | ICD-10-CM

## 2016-05-11 ENCOUNTER — Ambulatory Visit
Admission: RE | Admit: 2016-05-11 | Discharge: 2016-05-11 | Disposition: A | Discharge status: Home / Self Care | Payer: BLUE CROSS/BLUE SHIELD | Source: Ambulatory Visit | Attending: Gastroenterology | Admitting: Gastroenterology

## 2016-05-11 DIAGNOSIS — R140 Abdominal distension (gaseous): Secondary | ICD-10-CM

## 2016-05-11 DIAGNOSIS — R1011 Right upper quadrant pain: Secondary | ICD-10-CM | POA: Diagnosis not present

## 2016-05-16 DIAGNOSIS — K5909 Other constipation: Secondary | ICD-10-CM | POA: Diagnosis not present

## 2016-05-16 DIAGNOSIS — B829 Intestinal parasitism, unspecified: Secondary | ICD-10-CM | POA: Diagnosis not present

## 2016-10-04 ENCOUNTER — Encounter: Payer: Self-pay | Admitting: Family Medicine

## 2016-12-05 ENCOUNTER — Encounter: Payer: Self-pay | Admitting: Family Medicine

## 2016-12-05 ENCOUNTER — Ambulatory Visit: Payer: BLUE CROSS/BLUE SHIELD | Admitting: Family Medicine

## 2016-12-05 VITALS — BP 112/70 | HR 87 | Temp 98.0°F | Resp 12 | Ht 62.0 in | Wt 134.1 lb

## 2016-12-05 DIAGNOSIS — B373 Candidiasis of vulva and vagina: Secondary | ICD-10-CM

## 2016-12-05 DIAGNOSIS — N898 Other specified noninflammatory disorders of vagina: Secondary | ICD-10-CM | POA: Diagnosis not present

## 2016-12-05 DIAGNOSIS — B3731 Acute candidiasis of vulva and vagina: Secondary | ICD-10-CM

## 2016-12-05 MED ORDER — TERCONAZOLE 0.4 % VA CREA: 1.0000 | TOPICAL_CREAM | Freq: Every day | VAGINAL | 1 refills | Status: AC

## 2016-12-05 MED ORDER — FLUCONAZOLE 150 MG PO TABS: 150.0000 mg | ORAL_TABLET | Freq: Once | ORAL | 0 refills | Status: AC

## 2016-12-05 NOTE — Patient Instructions (Signed)
A few things to remember from today's visit:   Vaginal discharge  Vulvovaginal candidiasis - Plan: POCT urinalysis dipstick, fluconazole (DIFLUCAN) 150 MG tablet, terconazole (TERAZOL 7) 0.4 % vaginal cream   Vaginal Yeast infection, Adult Vaginal yeast infection is a condition that causes soreness, swelling, and redness (inflammation) of the vagina. It also causes vaginal discharge. This is a common condition. Some women get this infection frequently. What are the causes? This condition is caused by a change in the normal balance of the yeast (candida) and bacteria that live in the vagina. This change causes an overgrowth of yeast, which causes the inflammation. What increases the risk? This condition is more likely to develop in:  Women who take antibiotic medicines.  Women who have diabetes.  Women who take birth control pills.  Women who are pregnant.  Women who douche often.  Women who have a weak defense (immune) system.  Women who have been taking steroid medicines for a long time.  Women who frequently wear tight clothing.  What are the signs or symptoms? Symptoms of this condition include:  White, thick vaginal discharge.  Swelling, itching, redness, and irritation of the vagina. The lips of the vagina (vulva) may be affected as well.  Pain or a burning feeling while urinating.  Pain during sex.  How is this diagnosed? This condition is diagnosed with a medical history and physical exam. This will include a pelvic exam. Your health care provider will examine a sample of your vaginal discharge under a microscope. Your health care provider may send this sample for testing to confirm the diagnosis. How is this treated? This condition is treated with medicine. Medicines may be over-the-counter or prescription. You may be told to use one or more of the following:  Medicine that is taken orally.  Medicine that is applied as a cream.  Medicine that is inserted  directly into the vagina (suppository).  Follow these instructions at home:  Take or apply over-the-counter and prescription medicines only as told by your health care provider.  Do not have sex until your health care provider has approved. Tell your sex partner that you have a yeast infection. That person should go to his or her health care provider if he or she develops symptoms.  Do not wear tight clothes, such as pantyhose or tight pants.  Avoid using tampons until your health care provider approves.  Eat more yogurt. This may help to keep your yeast infection from returning.  Try taking a sitz bath to help with discomfort. This is a warm water bath that is taken while you are sitting down. The water should only come up to your hips and should cover your buttocks. Do this 3-4 times per day or as told by your health care provider.  Do not douche.  Wear breathable, cotton underwear.  If you have diabetes, keep your blood sugar levels under control. Contact a health care provider if:  You have a fever.  Your symptoms go away and then return.  Your symptoms do not get better with treatment.  Your symptoms get worse.  You have new symptoms.  You develop blisters in or around your vagina.  You have blood coming from your vagina and it is not your menstrual period.  You develop pain in your abdomen. This information is not intended to replace advice given to you by your health care provider. Make sure you discuss any questions you have with your health care provider. Document Released: 10/11/2004 Document  Revised: 06/15/2015 Document Reviewed: 07/05/2014 Elsevier Interactive Patient Education  [employer]2018 Elsevier Inc.  Please be sure medication list is accurate. If a new problem present, please set up appointment sooner than planned today.

## 2016-12-05 NOTE — Progress Notes (Signed)
ACUTE VISIT   HPI:  Chief Complaint  Patient presents with  . Vaginal Discharge  . Vaginal burning  . Vaginal Itching    Beverly Weeks is a 56 y.o. female, who is here today complaining of 5-7 days of vaginal pruritus and yellowish discharge.  She started OTC Teconazole and symptoms have improved. Concerned because yesterday vaginal discharge was light greenish.  She returned from [place] about 2 weeks ago and had mud baths. Denies risks for STD. She has not been sexually active for a month.  No associated fever,chills, arthralgias, rash, abdominal pain, vaginal bleeding, or urinary symptom.    Review of Systems  Constitutional: Negative for activity change, appetite change, fatigue and fever.  HENT: Negative for mouth sores and sore throat.   Eyes: Negative for redness and itching.  Gastrointestinal: Negative for abdominal pain, nausea and vomiting.  Genitourinary: Positive for vaginal discharge. Negative for decreased urine volume, dysuria, frequency, hematuria, pelvic pain, vaginal bleeding and vaginal pain.  Musculoskeletal: Negative for back pain, joint swelling and myalgias.  Skin: Negative for rash and wound.  Hematological: Negative for adenopathy. Does not bruise/bleed easily.  Psychiatric/Behavioral: Negative for confusion. The patient is nervous/anxious.       No current outpatient medications on file prior to visit.   No current facility-administered medications on file prior to visit.      Past Medical History:  Diagnosis Date  . Headache    No Known Allergies  Social History   Socioeconomic History  . Marital status: Married    Spouse name: None  . Number of children: None  . Years of education: None  . Highest education level: None  Social Needs  . Financial resource strain: None  . Food insecurity - worry: None  . Food insecurity - inability: None  . Transportation needs - medical: None  . Transportation needs -  non-medical: None  Occupational History  . None  Tobacco Use  . Smoking status: Former Smoker    Last attempt to quit: 01/16/1988    Years since quitting: 28.9  . Smokeless tobacco: Never Used  Substance and Sexual Activity  . Alcohol use: Yes    Comment: occasionally   . Drug use: No  . Sexual activity: Yes    Birth control/protection: Post-menopausal, Surgical  Other Topics Concern  . None  Social History Narrative  . None    Vitals:   12/05/16 0827  BP: 112/70  Pulse: 87  Resp: 12  Temp: 98 F (36.7 C)  SpO2: 99%   Body mass index is 24.53 kg/m.   Physical Exam  Nursing note and vitals reviewed. Constitutional: She is oriented to person, place, and time. She appears well-developed and well-nourished. No distress.  HENT:  Head: Normocephalic and atraumatic.  Eyes: Conjunctivae are normal.  Cardiovascular: Normal rate.  Respiratory: Effort normal. No respiratory distress.  GI: Soft. She exhibits no mass. There is no hepatomegaly. There is no tenderness.  Genitourinary: There is no rash, tenderness or lesion on the right labia. There is no rash, tenderness or lesion on the left labia. Cervix exhibits no motion tenderness, no discharge and no friability. No erythema, tenderness or bleeding in the vagina. No foreign body in the vagina. No vaginal discharge found.  Genitourinary Comments: Wet prep sample collected.  Musculoskeletal: She exhibits no edema or tenderness.  Lymphadenopathy:       Right: No inguinal adenopathy present.       Left: No inguinal adenopathy present.  Neurological: She is alert and oriented to person, place, and time. She has normal strength. Gait normal.  Skin: Skin is warm. No rash noted. No erythema.  Psychiatric: Her speech is normal. Her mood appears anxious.  Well groomed,good eye contact.     ASSESSMENT AND PLAN:   Ms. Beverly Weeks was seen today for vaginal discharge, vaginal burning and vaginal itching.  Diagnoses and all orders for this  visit:  Vaginal discharge  We discussed possible etiologies. No risk factors for STD's. She still would like wet prep done but does not thinks she needs CT or GC test done.  -     WET PREP FOR TRICH, YEAST, CLUE; Future  Vulvovaginal candidiasis  Based on clinical Hx and improving with OTC treatment. Rx for Terazol and Diflucan 150 mg x 1 given. Further recommendations will be given according to lab results. F/U as needed.  -     fluconazole (DIFLUCAN) 150 MG tablet; Take 1 tablet (150 mg total) by mouth once for 1 dose. -     terconazole (TERAZOL 7) 0.4 % vaginal cream; Place 1 applicator vaginally at bedtime for 7 days.    Return if symptoms worsen or fail to improve.    Betty G. SwazilandJordan, MD  [facility]. [place] office.

## 2016-12-07 ENCOUNTER — Encounter: Payer: Self-pay | Admitting: Family Medicine

## 2016-12-13 LAB — WET PREP BY MOLECULAR PROBE
CANDIDA SPECIES: NOT DETECTED
Gardnerella vaginalis: NOT DETECTED
MICRO NUMBER:: 81315004
SOURCE:: 0
SPECIMEN QUALITY:: ADEQUATE
TRICHOMONAS VAG: NOT DETECTED

## 2016-12-13 LAB — WET PREP FOR TRICH, YEAST, CLUE

## 2017-04-10 ENCOUNTER — Encounter: Payer: Self-pay | Admitting: Family Medicine

## 2017-04-10 ENCOUNTER — Ambulatory Visit
Admission: RE | Admit: 2017-04-10 | Discharge: 2017-04-10 | Disposition: A | Discharge status: Home / Self Care | Payer: BLUE CROSS/BLUE SHIELD | Source: Ambulatory Visit | Attending: Family Medicine | Admitting: Family Medicine

## 2017-04-10 ENCOUNTER — Ambulatory Visit: Payer: BLUE CROSS/BLUE SHIELD | Admitting: Family Medicine

## 2017-04-10 VITALS — BP 114/80 | Ht 62.0 in | Wt 130.0 lb

## 2017-04-10 DIAGNOSIS — M7581 Other shoulder lesions, right shoulder: Secondary | ICD-10-CM | POA: Diagnosis not present

## 2017-04-10 DIAGNOSIS — M542 Cervicalgia: Secondary | ICD-10-CM

## 2017-04-10 MED ORDER — MELOXICAM 15 MG PO TABS: 15.0000 mg | ORAL_TABLET | Freq: Every day | ORAL | 2 refills | Status: AC

## 2017-04-10 NOTE — Assessment & Plan Note (Signed)
Patient also has some symptoms consistent with rotator cuff tenderness in the right shoulder.  This may be secondary to some of the pain in her neck but will begin home exercises to treat this at this time.  Another possibility would be that patient's acromion may be impinging upon the supraspinatus causing irritation. -Plan as above.

## 2017-04-10 NOTE — Assessment & Plan Note (Addendum)
Patient is complaining of pain along the Right side of the cervical spine.  Initial impression is that patient is likely suffering from some myofascial spasmic pain within her scalene, trapezius, and rhomboid muscles.  However, at this time it is difficult to assess if this may be more radicular pain as patient did have a positive Spurling's test, but the pain did not radiate down the arm entirely. -Obtaining x-ray today. -Ordering MRI.  Patient had requested this but I feel as though it is appropriate at this time. -Prescription for Mobic provided.  OTC Tylenol as needed -HEP: Strengthening RC, Scapular stabilization, and Isometric C-spine -Encouraged regular icing and rest. -Patient is to follow-up after obtaining an MRI to go over results.

## 2017-04-10 NOTE — Progress Notes (Signed)
HPI  CC: Right neck and shoulder pain Patient is here with concerns about her right-sided neck and shoulder pain.  She states that she has been dealing with this pain over the past 6 months.  Pain is located along the base of her neck on the right side with extension posteriorly and distally down her right rhomboid.  Pain also seems to extend laterally across her shoulder to the distal aspect of her deltoid.  Pain is described as achy and occasionally sharp in nature.  Patient denies any injury, trauma, or event which may have caused this pain.  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  Unfortunately she has not had much improvement.  She denies any weakness, numbness, or paresthesias.  No extension of pain radiating down beyond the distal aspect of her deltoid.  No mechanical clicking or popping.  She endorses some pain with overhead activities.  Some worsening of her pain with neck movements.  Previous Interventions Tried: Stretching, massage, ice, heat, and Aleve  Past Injuries: Left ankle, left adhesive capsulitis of the shoulder Past Surgeries: Partial thyroidectomy, tubal ligation Smoking: Former smoker, quit 1990 Family Hx: Noncontributory  ROS: Per HPI; in addition no fever, no rash, no additional weakness, no additional numbness, no additional paresthesias, and no additional falls/injury.   Objective: BP 114/80   Ht 5\' 2"  (1.575 m)   Wt 130 lb (59 kg)   LMP 10/26/2013 (Approximate)   BMI 23.78 kg/m  Gen: NAD, well groomed, a/o x3, normal affect.  CV: Well-perfused. Warm.  Resp: Non-labored.  Neuro: Sensation intact throughout. No gross coordination deficits.  Gait: Nonpathologic posture, unremarkable stride without signs of limp or balance issues. Neck: TTP appreciated along the base of the right side of the neck.  Muscle tightness appreciated in this area with some extension  distally across the rhomboid.  Point tenderness noted along the mid body of the rhomboid muscle on the right side.  Range of motion of the neck seemingly limited secondary to pain.  Spurling's test was positive for pain but negative for any radiation down the arm. Shoulder, Right: TTP noted at the anterior and lateral aspect of the shoulder. No evidence of bony deformity, asymmetry, or muscle atrophy; Mild tenderness over long head of biceps (bicipital groove). No TTP [facility]. Full active and passive range of motion (180 flex Elisabeth Most/60Ext /150Abd /90ER /70IR), Thumb to T12 without significant tenderness. Strength 5/5 throughout. No abnormal scapular function observed. Sensation intact. Peripheral pulses intact.  Special Tests:   - Crossarm test: NEG   - Empty can: Positive   - Hawkins: Positive   - Obrien's test: Positive   - Yergason's: Positive   - Speeds test: NEG   Assessment and Plan:  Cervicalgia Patient is complaining of pain along the Right side of the cervical spine.  Initial impression is that patient is likely suffering from some myofascial spasmic pain within her scalene, trapezius, and rhomboid muscles.  However, at this time it is difficult to assess if this may be more radicular pain as patient did have a positive Spurling's test, but the pain did not radiate down the arm entirely. -Obtaining x-ray today. -Ordering MRI.  Patient had requested this but I feel as though it is appropriate at this time. -Prescription for Mobic provided.  OTC Tylenol as needed -HEP: Strengthening RC, Scapular stabilization, and Isometric C-spine -Encouraged regular icing and rest. -Patient is  to follow-up after obtaining an MRI to go over results.  Rotator cuff tendinitis, right Patient also has some symptoms consistent with rotator cuff tenderness in the right shoulder.  This may be secondary to some of the pain in her neck but will begin home exercises to treat this at this time.  Another  possibility would be that patient's acromion may be impinging upon the supraspinatus causing irritation. -Plan as above.   Orders Placed This Encounter  Procedures  . DG Cervical Spine 2 or 3 views    Standing Status:   Future    Standing Expiration Date:   06/11/2018    Order Specific Question:   Reason for Exam (SYMPTOM  OR DIAGNOSIS REQUIRED)    Answer:   neck pain    Order Specific Question:   Is patient pregnant?    Answer:   No    Order Specific Question:   Preferred imaging location?    Answer:   [facility]    Order Specific Question:   Radiology Contrast Protocol - do NOT remove file path    Answer:   \\[email or web address]  . MR CERVICAL SPINE WO CONTRAST    Standing Status:   Future    Standing Expiration Date:   06/11/2018    Order Specific Question:   What is the patient's sedation requirement?    Answer:   No Sedation    Order Specific Question:   Does the patient have a pacemaker or implanted devices?    Answer:   No    Order Specific Question:   Preferred imaging location?    Answer:   [facility] (table limit-550lbs)    Order Specific Question:   Radiology Contrast Protocol - do NOT remove file path    Answer:   \\[email or web address]    Order Specific Question:   Reason for Exam additional comments    Answer:   rule out cervical disc disease    Order Specific Question:   Is patient pregnant?    Answer:   No    Meds ordered this encounter  Medications  . meloxicam (MOBIC) 15 MG tablet    Sig: Take 1 tablet (15 mg total) by mouth daily.    Dispense:  30 tablet    Refill:  2     Kathee Delton, MD,MS [facility] Sports Medicine Fellow 04/10/2017 3:03 PM

## 2017-04-22 ENCOUNTER — Ambulatory Visit
Admission: RE | Admit: 2017-04-22 | Discharge: 2017-04-22 | Disposition: A | Discharge status: Home / Self Care | Payer: BLUE CROSS/BLUE SHIELD | Source: Ambulatory Visit | Attending: Family Medicine | Admitting: Family Medicine

## 2017-04-22 DIAGNOSIS — M542 Cervicalgia: Secondary | ICD-10-CM

## 2017-04-22 DIAGNOSIS — M4802 Spinal stenosis, cervical region: Secondary | ICD-10-CM | POA: Diagnosis not present

## 2017-04-24 ENCOUNTER — Ambulatory Visit: Payer: BLUE CROSS/BLUE SHIELD | Admitting: Family Medicine

## 2017-04-24 ENCOUNTER — Encounter: Payer: Self-pay | Admitting: Family Medicine

## 2017-04-24 DIAGNOSIS — M542 Cervicalgia: Secondary | ICD-10-CM

## 2017-04-24 NOTE — Progress Notes (Signed)
   HPI  CC: Follow-up cervical neck pain. Patient is here to follow-up for her right-sided cervical neck pain.  At the last visit we had ordered a MRI of her cervical spine.  Results showed some foraminal stenosis at the C5-6 region.  There is some disc bulge as well in this region, in general degenerative changes along the mid C-spine.  Patient states that she continues to have symptoms down the right side of her neck into her right shoulder.  No significant improvement since last being seen.  She states that she has not been very compliant with the home exercises provided at the last visit and she never took any of the meloxicam that had been provided.  She denies any new symptoms.  No worsening symptoms.  No muscle weakness or persistent numbness.  Medications/Interventions Tried: Stretching, massage, ice, heat, Aleve  See HPI and/or previous note for associated ROS.  Objective: BP 116/63   Ht 5\' 2"  (1.575 m)   Wt 130 lb (59 kg)   LMP 10/26/2013 (Approximate)   BMI 23.78 kg/m  Gen: NAD, well groomed, a/o x3, normal affect.  CV: Well-perfused. Warm.  Resp: Non-labored.  Neuro: Sensation intact throughout. No gross coordination deficits.  Gait: Nonpathologic posture, unremarkable stride without signs of limp or balance issues. Neck: TTP appreciated along the base of the right side of the neck.  Muscle tightness appreciated in this area with some extension distally across the rhomboid.  Point tenderness noted along the mid body of the rhomboid muscle on the right side.  Range of motion of the neck seemingly limited in most directions except flexion.  Spurling's test was positive for pain but negative for any radiation down the arm.   Assessment and plan:  Cervicalgia Patient is here to follow-up regarding her cervical neck pain with radiculopathy.  MRI revealed C5-6 disc bulge with nerve compression on the right side within the cervical foramina.  We discussed possible treatment options  at this time.  Patient would like to move forward conservatively. -Formal physical therapy prescription provided today. -Continue Mobic -Strict return precautions and warning signs discussed today. -Follow-up 4-6 weeks   Kathee DeltonIan D Alandra Sando, MD,MS [facility] Sports Medicine Fellow 04/24/2017 5:44 PM

## 2017-04-24 NOTE — Assessment & Plan Note (Signed)
Patient is here to follow-up regarding her cervical neck pain with radiculopathy.  MRI revealed C5-6 disc bulge with nerve compression on the right side within the cervical foramina.  We discussed possible treatment options at this time.  Patient would like to move forward conservatively. -Formal physical therapy prescription provided today. -Continue Mobic -Strict return precautions and warning signs discussed today. -Follow-up 4-6 weeks

## 2017-05-14 DIAGNOSIS — Z9889 Other specified postprocedural states: Secondary | ICD-10-CM | POA: Insufficient documentation

## 2017-05-14 DIAGNOSIS — E890 Postprocedural hypothyroidism: Secondary | ICD-10-CM | POA: Insufficient documentation

## 2017-05-14 DIAGNOSIS — M542 Cervicalgia: Secondary | ICD-10-CM | POA: Diagnosis not present

## 2017-05-14 NOTE — Progress Notes (Signed)
HPI:   [name] is a 57 y.o. female, who is here today for her routine physical.  Last CPE: 01/2016  Regular exercise 3 or more time per week: Walking a few times per week. Following a healthy diet: Yes. She lives with her husband.  Chronic medical problems: Recently she had cervicalgia and shoulder pain, following with Dr Wende Mott.  S/P partial thyroidectomy.  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   There is no immunization history on file for this patient. Tdap 02/26/14. She does not take influenza vaccines.  Mammogram: 03/2015 [facility] 1 Colonoscopy: 04/2013 sessile polypectomy,diverticulosis.Pathology: Tubular adenoma , due for colonoscopy in 2020. DEXA: N/A  Hep C screening: 01/2016 NR  She has no concerns today.   Review of Systems  Constitutional: Negative for appetite change, fatigue, fever and unexpected weight change.  HENT: Negative for dental problem, hearing loss, nosebleeds, sore throat and trouble swallowing.   Eyes: Negative for photophobia and visual disturbance.  Respiratory: Negative for cough, shortness of breath and wheezing.   Cardiovascular: Negative for chest pain and leg swelling.  Gastrointestinal: Negative for abdominal pain, blood in stool, nausea and vomiting.       No changes in bowel habits.  Endocrine: Negative for cold intolerance, heat intolerance, polydipsia, polyphagia and polyuria.  Genitourinary: Negative for decreased urine volume, dysuria, hematuria, menstrual problem, vaginal bleeding and vaginal discharge.       No breast tenderness or nipple discharge.  Musculoskeletal: Positive for arthralgias and neck pain. Negative for gait problem and joint swelling.  Skin: Negative for rash.  Neurological: Negative for syncope, weakness, numbness and headaches.  Hematological: Negative for adenopathy. Does not bruise/bleed easily.  Psychiatric/Behavioral:  Negative for confusion and sleep disturbance. The patient is not nervous/anxious.   All other systems reviewed and are negative.     Current Outpatient Medications on File Prior to Visit  Medication Sig Dispense Refill  . meloxicam (MOBIC) 15 MG tablet Take 1 tablet (15 mg total) by mouth daily. (Patient taking differently: Take 15 mg by mouth as needed. ) 30 tablet 2   No current facility-administered medications on file prior to visit.      Past Medical History:  Diagnosis Date  . Headache     Past Surgical History:  Procedure Laterality Date  . THYROIDECTOMY, PARTIAL Left 2011  . TUBAL LIGATION  2008    No Known Allergies  Family History  Problem Relation Age of Onset  . Diabetes Maternal Aunt   . Diabetes Maternal Grandmother     Social History   Socioeconomic History  . Marital status: Married    Spouse name: Not on file  . Number of children: Not on file  . Years of education: Not on file  . Highest education level: Not on file  Occupational History  . Not on file  Social Needs  . Financial resource strain: Not on file  . Food insecurity:    Worry: Not on file    Inability: Not on file  . Transportation needs:    Medical: Not on file    Non-medical: Not on file  Tobacco Use  . Smoking status: Former Smoker    Last attempt to quit: 01/16/1988    Years since quitting: 29.3  . Smokeless tobacco: Never Used  Substance and Sexual Activity  . Alcohol use: Yes    Comment: occasionally   .  Drug use: No  . Sexual activity: Yes    Birth control/protection: Post-menopausal, Surgical  Lifestyle  . Physical activity:    Days per week: Not on file    Minutes per session: Not on file  . Stress: Not on file  Relationships  . Social connections:    Talks on phone: Not on file    Gets together: Not on file    Attends religious service: Not on file    Active member of club or organization: Not on file    Attends meetings of clubs or organizations: Not on file     Relationship status: Not on file  Other Topics Concern  . Not on file  Social History Narrative  . Not on file     Vitals:   05/15/17 0850  BP: 118/70  Pulse: 76  Resp: 12  Temp: 98.7 F (37.1 C)  SpO2: 98%   Body mass index is 24.58 kg/m.   Wt Readings from Last 3 Encounters:  05/15/17 134 lb 6 oz (61 kg)  04/24/17 130 lb (59 kg)  04/10/17 130 lb (59 kg)    Physical Exam  Nursing note and vitals reviewed. Constitutional: She is oriented to person, place, and time. She appears well-developed and well-nourished. No distress.  HENT:  Head: Normocephalic and atraumatic.  Right Ear: Hearing, tympanic membrane, external ear and ear canal normal.  Left Ear: Hearing, tympanic membrane, external ear and ear canal normal.  Mouth/Throat: Uvula is midline, oropharynx is clear and moist and mucous membranes are normal.  Eyes: Pupils are equal, round, and reactive to light. Conjunctivae and EOM are normal.  Neck: No tracheal deviation present. No thyromegaly present.  Cardiovascular: Normal rate and regular rhythm.  No murmur heard. Pulses:      Dorsalis pedis pulses are 2+ on the right side, and 2+ on the left side.       Posterior tibial pulses are 2+ on the right side, and 2+ on the left side.  Respiratory: Effort normal and breath sounds normal. No respiratory distress. No breast swelling or tenderness.  GI: Soft. She exhibits no mass. There is no hepatomegaly. There is no tenderness.  Genitourinary: Vagina normal. There is no rash, tenderness or lesion on the right labia. There is no rash, tenderness or lesion on the left labia. Uterus is not enlarged and not tender. Cervix exhibits no motion tenderness, no discharge and no friability. Right adnexum displays no mass, no tenderness and no fullness. Left adnexum displays no mass, no tenderness and no fullness.  Genitourinary Comments: Breast: No masses,nipple discharge,or skin abnormalities. Pap smear collected.    Musculoskeletal: She exhibits no edema.  No major deformity or signs of synovitis appreciated.  Lymphadenopathy:    She has no cervical adenopathy.       Right: No inguinal and no supraclavicular adenopathy present.       Left: No inguinal and no supraclavicular adenopathy present.  Neurological: She is alert and oriented to person, place, and time. She has normal strength. No cranial nerve deficit. Coordination and gait normal.  Reflex Scores:      Bicep reflexes are 2+ on the right side and 2+ on the left side.      Patellar reflexes are 2+ on the right side and 2+ on the left side. Skin: Skin is warm. No rash noted. No erythema.  Psychiatric: She has a normal mood and affect. Cognition and memory are normal.  Well groomed, good eye contact.  ASSESSMENT AND PLAN:  Ms. Beverly Weeks was here today annual physicaIcesis Renn.  Orders Placed This Encounter  Procedures  . TSH  . Lipid panel  . Comprehensive metabolic panel    Lab Results  Component Value Date   TSH 3.41 05/15/2017   Lab Results  Component Value Date   CREATININE 0.74 05/15/2017   BUN 9 05/15/2017   NA 140 05/15/2017   K 4.5 05/15/2017   CL 104 05/15/2017   CO2 32 05/15/2017   Lab Results  Component Value Date   ALT 16 05/15/2017   AST 20 05/15/2017   ALKPHOS 86 05/15/2017   BILITOT 0.4 05/15/2017   Lab Results  Component Value Date   CHOL 221 (H) 05/15/2017   HDL 55.10 05/15/2017   LDLCALC 137 (H) 05/15/2017   TRIG 147.0 05/15/2017   CHOLHDL 4 05/15/2017     Routine general medical examination at a health care facility  We discussed the importance of regular physical activity and healthy diet for prevention of chronic illness and/or complications. Preventive guidelines reviewed. Vaccination up to date.  Ca++ and vit D supplementation to continue. Next CPE in a year.  The 10-year ASCVD risk score Denman George DC Montez Hageman., et al., 2013) is: 1.9%   Values used to calculate the score:      Age: 76 years     Sex: Female     Is Non-Hispanic African American: No     Diabetic: No     Tobacco smoker: No     Systolic Blood Pressure: 118 mmHg     Is BP treated: No     HDL Cholesterol: 55.1 mg/dL     Total Cholesterol: 221 mg/dL   S/P partial thyroidectomy -     TSH  Screening for lipid disorders -     Lipid panel  Diabetes mellitus screening -     Comprehensive metabolic panel  Cervical cancer screening -     Cytology - PAP ([place])    Return in 1 year (on 05/16/2018) for CPE.       Darcy Barbara G. [place], MD  [facility]. [place] office.

## 2017-05-15 ENCOUNTER — Encounter: Payer: Self-pay | Admitting: Family Medicine

## 2017-05-15 ENCOUNTER — Ambulatory Visit (INDEPENDENT_AMBULATORY_CARE_PROVIDER_SITE_OTHER): Payer: BLUE CROSS/BLUE SHIELD | Admitting: Family Medicine

## 2017-05-15 ENCOUNTER — Other Ambulatory Visit (HOSPITAL_COMMUNITY)
Admission: RE | Admit: 2017-05-15 | Discharge: 2017-05-15 | Disposition: A | Discharge status: Home / Self Care | Payer: BLUE CROSS/BLUE SHIELD | Source: Ambulatory Visit | Attending: Family Medicine | Admitting: Family Medicine

## 2017-05-15 VITALS — BP 118/70 | HR 76 | Temp 98.7°F | Resp 12 | Ht 62.0 in | Wt 134.4 lb

## 2017-05-15 DIAGNOSIS — Z9889 Other specified postprocedural states: Secondary | ICD-10-CM

## 2017-05-15 DIAGNOSIS — Z13220 Encounter for screening for lipoid disorders: Secondary | ICD-10-CM | POA: Diagnosis not present

## 2017-05-15 DIAGNOSIS — Z131 Encounter for screening for diabetes mellitus: Secondary | ICD-10-CM | POA: Diagnosis not present

## 2017-05-15 DIAGNOSIS — E890 Postprocedural hypothyroidism: Secondary | ICD-10-CM

## 2017-05-15 DIAGNOSIS — Z0000 Encounter for general adult medical examination without abnormal findings: Secondary | ICD-10-CM

## 2017-05-15 DIAGNOSIS — Z124 Encounter for screening for malignant neoplasm of cervix: Secondary | ICD-10-CM

## 2017-05-15 DIAGNOSIS — M546 Pain in thoracic spine: Secondary | ICD-10-CM | POA: Diagnosis not present

## 2017-05-15 DIAGNOSIS — S46112D Strain of muscle, fascia and tendon of long head of biceps, left arm, subsequent encounter: Secondary | ICD-10-CM | POA: Diagnosis not present

## 2017-05-15 DIAGNOSIS — M542 Cervicalgia: Secondary | ICD-10-CM | POA: Diagnosis not present

## 2017-05-15 LAB — COMPREHENSIVE METABOLIC PANEL
ALK PHOS: 86 U/L (ref 39–117)
ALT: 16 U/L (ref 0–35)
AST: 20 U/L (ref 0–37)
Albumin: 4.2 g/dL (ref 3.5–5.2)
BILIRUBIN TOTAL: 0.4 mg/dL (ref 0.2–1.2)
BUN: 9 mg/dL (ref 6–23)
CALCIUM: 9.3 mg/dL (ref 8.4–10.5)
CO2: 32 meq/L (ref 19–32)
Chloride: 104 mEq/L (ref 96–112)
Creatinine, Ser: 0.74 mg/dL (ref 0.40–1.20)
GFR: 86.38 mL/min (ref >60.00)
Glucose, Bld: 70 mg/dL (ref 70–99)
POTASSIUM: 4.5 meq/L (ref 3.5–5.1)
Sodium: 140 mEq/L (ref 135–145)
TOTAL PROTEIN: 6.9 g/dL (ref 6.0–8.3)

## 2017-05-15 LAB — LIPID PANEL
CHOL/HDL RATIO: 4
Cholesterol: 221 mg/dL — ABNORMAL HIGH (ref 0–200)
HDL: 55.1 mg/dL (ref >39.00)
LDL Cholesterol: 137 mg/dL — ABNORMAL HIGH (ref 0–99)
NonHDL: 166.33
TRIGLYCERIDES: 147 mg/dL (ref 0.0–149.0)
VLDL: 29.4 mg/dL (ref 0.0–40.0)

## 2017-05-15 LAB — TSH: TSH: 3.41 u[IU]/mL (ref 0.35–4.50)

## 2017-05-15 NOTE — Patient Instructions (Addendum)
A few things to remember from today's visit:   Routine general medical examination at a health care facility  S/P partial thyroidectomy - Plan: TSH  Screening for lipid disorders - Plan: Lipid panel  Diabetes mellitus screening - Plan: Comprehensive metabolic panel  Cervical cancer screening - Plan: Cytology - PAP ([place])  Today you have you routine preventive visit.  At least 150 minutes of moderate exercise per week, daily brisk walking for 15-30 min is a good exercise option. Healthy diet low in saturated (animal) fats and sweets and consisting of fresh fruits and vegetables, lean meats such as fish and white chicken and whole grains.  These are some of recommendations for screening depending of age and risk factors:   - Vaccines:  Tdap vaccine every 10 years.  Shingles vaccine recommended at age 36, could be given after 56 years of age but not sure about insurance coverage.   Pneumonia vaccines:  Prevnar 13 at 65 and Pneumovax at 66. Sometimes Pneumovax is giving earlier if history of smoking, lung disease,diabetes,kidney disease among some.    Screening for diabetes at age 31 and every 3 years.  Cervical cancer prevention:  Pap smear starts at 56 years of age and continues periodically until 56 years old in low risk women. Pap smear every 3 years between 44 and 84 years old. Pap smear every 3-5 years between women 30 and older if pap smear negative and HPV screening negative.   -Breast cancer: Mammogram: There is disagreement between experts about when to start screening in low risk asymptomatic female but recent recommendations are to start screening at 12 and not later than 56 years old , every 1-2 years and after 56 yo q 2 years. Screening is recommended until 56 years old but some women can continue screening depending of healthy issues. Please schedule mammogram.  Colon cancer screening: starts at 56 years old until 56 years old.  Cholesterol disorder  screening at age 21 and every 3 years.  Also recommended:  1. Dental visit- Brush and floss your teeth twice daily; visit your dentist twice a year. 2. Eye doctor- Get an eye exam at least every 2 years. 3. Helmet use- Always wear a helmet when riding a bicycle, motorcycle, rollerblading or skateboarding. 4. Safe sex- If you may be exposed to sexually transmitted infections, use a condom. 5. Seat belts- Seat belts can save your live; always wear one. 6. Smoke/Carbon Monoxide detectors- These detectors need to be installed on the appropriate level of your home. Replace batteries at least once a year. 7. Skin cancer- When out in the sun please cover up and use sunscreen 15 SPF or higher. 8. Violence- If anyone is threatening or hurting you, please tell your healthcare provider.  9. Drink alcohol in moderation- Limit alcohol intake to one drink or less per day. Never drink and drive.  Please be sure medication list is accurate. If a new problem present, please set up appointment sooner than planned today.

## 2017-05-16 ENCOUNTER — Encounter: Payer: Self-pay | Admitting: Family Medicine

## 2017-05-16 LAB — CYTOLOGY - PAP
Diagnosis: NEGATIVE
HPV: NOT DETECTED

## 2017-05-29 DIAGNOSIS — S46112D Strain of muscle, fascia and tendon of long head of biceps, left arm, subsequent encounter: Secondary | ICD-10-CM | POA: Diagnosis not present

## 2017-05-29 DIAGNOSIS — M546 Pain in thoracic spine: Secondary | ICD-10-CM | POA: Diagnosis not present

## 2017-05-29 DIAGNOSIS — M542 Cervicalgia: Secondary | ICD-10-CM | POA: Diagnosis not present

## 2017-05-30 DIAGNOSIS — M546 Pain in thoracic spine: Secondary | ICD-10-CM | POA: Diagnosis not present

## 2017-05-30 DIAGNOSIS — M542 Cervicalgia: Secondary | ICD-10-CM | POA: Diagnosis not present

## 2017-05-30 DIAGNOSIS — S46112D Strain of muscle, fascia and tendon of long head of biceps, left arm, subsequent encounter: Secondary | ICD-10-CM | POA: Diagnosis not present

## 2017-06-04 DIAGNOSIS — S46112D Strain of muscle, fascia and tendon of long head of biceps, left arm, subsequent encounter: Secondary | ICD-10-CM | POA: Diagnosis not present

## 2017-06-04 DIAGNOSIS — M542 Cervicalgia: Secondary | ICD-10-CM | POA: Diagnosis not present

## 2017-06-04 DIAGNOSIS — M546 Pain in thoracic spine: Secondary | ICD-10-CM | POA: Diagnosis not present

## 2017-06-11 DIAGNOSIS — M542 Cervicalgia: Secondary | ICD-10-CM | POA: Diagnosis not present

## 2017-06-11 DIAGNOSIS — M546 Pain in thoracic spine: Secondary | ICD-10-CM | POA: Diagnosis not present

## 2017-06-11 DIAGNOSIS — S46112D Strain of muscle, fascia and tendon of long head of biceps, left arm, subsequent encounter: Secondary | ICD-10-CM | POA: Diagnosis not present

## 2017-06-17 DIAGNOSIS — S46112D Strain of muscle, fascia and tendon of long head of biceps, left arm, subsequent encounter: Secondary | ICD-10-CM | POA: Diagnosis not present

## 2017-06-17 DIAGNOSIS — M542 Cervicalgia: Secondary | ICD-10-CM | POA: Diagnosis not present

## 2017-06-17 DIAGNOSIS — M546 Pain in thoracic spine: Secondary | ICD-10-CM | POA: Diagnosis not present

## 2017-06-19 DIAGNOSIS — M546 Pain in thoracic spine: Secondary | ICD-10-CM | POA: Diagnosis not present

## 2017-06-19 DIAGNOSIS — S46112D Strain of muscle, fascia and tendon of long head of biceps, left arm, subsequent encounter: Secondary | ICD-10-CM | POA: Diagnosis not present

## 2017-06-19 DIAGNOSIS — M542 Cervicalgia: Secondary | ICD-10-CM | POA: Diagnosis not present

## 2017-06-24 DIAGNOSIS — S46112D Strain of muscle, fascia and tendon of long head of biceps, left arm, subsequent encounter: Secondary | ICD-10-CM | POA: Diagnosis not present

## 2017-06-24 DIAGNOSIS — M542 Cervicalgia: Secondary | ICD-10-CM | POA: Diagnosis not present

## 2017-06-24 DIAGNOSIS — M546 Pain in thoracic spine: Secondary | ICD-10-CM | POA: Diagnosis not present

## 2017-07-01 DIAGNOSIS — M542 Cervicalgia: Secondary | ICD-10-CM | POA: Diagnosis not present

## 2017-07-01 DIAGNOSIS — S46112D Strain of muscle, fascia and tendon of long head of biceps, left arm, subsequent encounter: Secondary | ICD-10-CM | POA: Diagnosis not present

## 2017-07-01 DIAGNOSIS — Z1283 Encounter for screening for malignant neoplasm of skin: Secondary | ICD-10-CM | POA: Diagnosis not present

## 2017-07-01 DIAGNOSIS — L821 Other seborrheic keratosis: Secondary | ICD-10-CM | POA: Diagnosis not present

## 2017-07-01 DIAGNOSIS — M546 Pain in thoracic spine: Secondary | ICD-10-CM | POA: Diagnosis not present

## 2017-08-09 ENCOUNTER — Encounter: Payer: Self-pay | Admitting: Family Medicine

## 2017-08-09 ENCOUNTER — Ambulatory Visit: Payer: BLUE CROSS/BLUE SHIELD | Admitting: Family Medicine

## 2017-08-09 VITALS — BP 120/80 | HR 88 | Temp 98.5°F | Wt 131.0 lb

## 2017-08-09 DIAGNOSIS — L42 Pityriasis rosea: Secondary | ICD-10-CM | POA: Diagnosis not present

## 2017-08-09 MED ORDER — HYDROCORTISONE 1 % EX CREA: 1.0000 "application " | TOPICAL_CREAM | Freq: Two times a day (BID) | CUTANEOUS | 0 refills | Status: AC

## 2017-08-09 MED ORDER — HYDROXYZINE HCL 25 MG PO TABS: 25.0000 mg | ORAL_TABLET | Freq: Three times a day (TID) | ORAL | 1 refills | Status: AC | PRN

## 2017-08-09 NOTE — Patient Instructions (Signed)
Pityriasis Rosea Pityriasis rosea is a rash that usually appears on the trunk of the body. It may also appear on the upper arms and upper legs. It usually begins as a single patch, and then more patches begin to develop. The rash may cause mild itching, but it normally does not cause other problems. It usually goes away without treatment. However, it may take weeks or months for the rash to go away completely. What are the causes? The cause of this condition is not known. The condition does not spread from person to person (is noncontagious). What increases the risk? This condition is more likely to develop in young adults and children. It is most common in the spring and fall. What are the signs or symptoms? The main symptom of this condition is a rash.  The rash usually begins with a single oval patch that is larger than the ones that follow. This is called a herald patch. It generally appears a week or more before the rest of the rash appears.  When more patches start to develop, they spread quickly on the trunk, back, and arms. These patches are smaller than the first one.  The patches that make up the rash are usually oval-shaped and pink or red in color. They are usually flat, but they may sometimes be raised so that they can be felt with a finger. They may also be finely crinkled and have a scaly ring around the edge.  The rash does not typically appear on areas of the skin that are exposed to the sun.  Most people who have this condition do not have other symptoms, but some have mild itching. In a few cases, a mild headache or body aches may occur before the rash appears and then go away. How is this diagnosed? Your health care provider may diagnose this condition by doing a physical exam and taking your medical history. To rule out other possible causes for the rash, the health care provider may order blood tests or take a skin sample from the rash to be looked at under a microscope. How  is this treated? Usually, treatment is not needed for this condition. The rash will probably go away on its own in 4-8 weeks. In some cases, a health care provider may recommend or prescribe medicine to reduce itching. Follow these instructions at home:  Take medicines only as directed by your health care provider.  Avoid scratching the affected areas of skin.  Do not take hot baths or use a sauna. Use only warm water when bathing or showering. Heat can increase itching. Contact a health care provider if:  Your rash does not go away in 8 weeks.  Your rash gets much worse.  You have a fever.  You have swelling or pain in the rash area.  You have fluid, blood, or pus coming from the rash area. This information is not intended to replace advice given to you by your health care provider. Make sure you discuss any questions you have with your health care provider. Document Released: 02/07/2001 Document Revised: 06/09/2015 Document Reviewed: 12/09/2013 Elsevier Interactive Patient Education  2018 [employer]  

## 2017-08-09 NOTE — Progress Notes (Signed)
Subjective:    Patient ID: Beverly DibbleAna Maria Weeks, female    DOB: November 09, 1960, 57 y.o.   MRN: [id number]  No chief complaint on file.   HPI Patient was seen today for rash.  Pt end[place] the [place].  She noted bites around her bikini area after coming out the ocean.  That rash resolved after 1 week.  Pt has since  developed a new pruritic rash with red spots all over her body x 1 week.  Pt has not tried anything for the rash.  Pt denies fever, chills, nausea, vomiting, cough, runny nose.  Past Medical History:  Diagnosis Date  . Headache     No Known Allergies  ROS General: Denies fever, chills, night sweats, changes in weight, changes in appetite HEENT: Denies headaches, ear pain, changes in vision, rhinorrhea, sore throat CV: Denies CP, palpitations, SOB, orthopnea Pulm: Denies SOB, cough, wheezing GI: Denies abdominal pain, nausea, vomiting, diarrhea, constipation GU: Denies dysuria, hematuria, frequency, vaginal discharge Msk: Denies muscle cramps, joint pains Neuro: Denies weakness, numbness, tingling Skin: Denies bruising  + pruritic rash Psych: Denies depression, anxiety, hallucinations     Objective:    Blood pressure 120/80, pulse 88, temperature 98.5 F (36.9 C), temperature source Oral, weight 131 lb (59.4 kg), last menstrual period 10/26/2013, SpO2 98 %.   Gen. Pleasant, well-nourished, in no distress, normal affect   HEENT: [place]/AT, face symmetric, no scleral icterus, nares patent without drainage. Lungs: no accessory muscle use Cardiovascular: RRR, no m/r/g Neuro:  A&Ox3, CN II-XII intact, normal gait Skin:  Warm, dry, intact.  Erythematous slightly raised ~1 cm papules on trunk and proximal extremities (upper thighs, glutes, and upper arms), no lesions on neck or face.   Wt Readings from Last 3 Encounters:  08/09/17 131 lb (59.4 kg)  05/15/17 134 lb 6 oz (61 kg)  04/24/17 130 lb (59 kg)    Lab Results  Component Value Date   WBC 6.9  04/24/2015   HGB 12.5 04/24/2015   HCT 37.4 04/24/2015   PLT 266 04/24/2015   GLUCOSE 70 05/15/2017   CHOL 221 (H) 05/15/2017   TRIG 147.0 05/15/2017   HDL 55.10 05/15/2017   LDLCALC 137 (H) 05/15/2017   ALT 16 05/15/2017   AST 20 05/15/2017   NA 140 05/15/2017   K 4.5 05/15/2017   CL 104 05/15/2017   CREATININE 0.74 05/15/2017   BUN 9 05/15/2017   CO2 32 05/15/2017   TSH 3.41 05/15/2017    Assessment/Plan:  Pityriasis rosea  -Dr. Clent RidgesFry consulted for 2nd opinion, appreciate his assistance. -discussed disease course, unrelated to initial rash. -given handout -pt to try hydroxyzine 25 mg this evening/tonight to see if it make her sleepy.  If is does not she can use prn during the day for itching. -also given cortisone cream in the event the hydroxyzine does not work.  Could consider an OTC allergy pill if hydroxyzine not helpful. - Plan: hydrOXYzine (ATARAX/VISTARIL) 25 MG tablet, hydrocortisone cream 1 %  F/u prn  Abbe AmsterdamShannon Ayodele Sangalang, MD

## 2018-01-09 DIAGNOSIS — H00011 Hordeolum externum right upper eyelid: Secondary | ICD-10-CM | POA: Diagnosis not present

## 2018-01-11 DIAGNOSIS — J101 Influenza due to other identified influenza virus with other respiratory manifestations: Secondary | ICD-10-CM | POA: Diagnosis not present

## 2018-01-11 DIAGNOSIS — H00011 Hordeolum externum right upper eyelid: Secondary | ICD-10-CM | POA: Diagnosis not present

## 2018-01-11 DIAGNOSIS — R52 Pain, unspecified: Secondary | ICD-10-CM | POA: Diagnosis not present

## 2018-07-22 ENCOUNTER — Ambulatory Visit
Admission: RE | Admit: 2018-07-22 | Discharge: 2018-07-22 | Disposition: A | Discharge status: Home / Self Care | Payer: BC Managed Care – PPO | Source: Ambulatory Visit | Attending: Sports Medicine | Admitting: Sports Medicine

## 2018-07-22 ENCOUNTER — Ambulatory Visit: Payer: BC Managed Care – PPO | Admitting: Sports Medicine

## 2018-07-22 ENCOUNTER — Other Ambulatory Visit: Payer: Self-pay

## 2018-07-22 VITALS — BP 116/70 | Ht 62.0 in | Wt 130.0 lb

## 2018-07-22 DIAGNOSIS — M25522 Pain in left elbow: Secondary | ICD-10-CM | POA: Diagnosis not present

## 2018-07-23 ENCOUNTER — Encounter: Payer: Self-pay | Admitting: Sports Medicine

## 2018-07-23 NOTE — Progress Notes (Signed)
   Subjective:    Patient ID: Beverly Weeks, female    DOB: 11/05/1960, 58 y.o.   MRN: [id number]  HPI chief complaint: Bilateral elbow pain  Very pleasant right-hand-dominant 58 year old female comes in today complaining of several months of bilateral elbow pain.  She does not recall any specific injury but describes a gradual onset of pain that is worse along the medial elbow bilaterally.  She does endorse some pain along the lateral elbow as well.  She has tried home exercise program consisting of stretching and massage.  This has not been very helpful.  She also has tried over-the-counter Aleve which has been minimally helpful.  She notes some swelling along the medial aspect of both elbows which is intermittent.  She denies numbness or tingling into her forearms or hands but she does endorse some weakness.  She denies any prior occurrences.  No prior elbow surgeries.  She does have a history of cervical radiculopathy which was treated conservatively with physical therapy with excellent results and she states that her current symptoms are different in nature than what she experienced with that diagnosis.  Past medical history reviewed Medications reviewed Allergies reviewed   Review of Systems As above    Objective:   Physical Exam  Well-developed, well-nourished.  No acute distress.  Awake alert and oriented x3.  Vital signs reviewed.  Examination of both elbows shows full range of motion bilaterally.  No obvious effusion.  There is some mild soft tissue swelling along the medial aspect of the left elbow and tenderness to palpation over the area of the common flexor tendon.  There is also some mild weakness in the left forearm and hand with resisted wrist flexion and ulnar deviation.  Negative Tinel's over the cubital tunnel bilaterally.  Mild tenderness over the lateral epicondyles bilaterally with mildly reproducible pain with ECRB testing.  No tenderness over the triceps.  Remainder of  strength testing in both upper extremities is normal.  Good pulses distally.  X-rays of the more symptomatic left elbow including AP and lateral views are unremarkable      Assessment & Plan:   Bilateral elbow pain, left greater than right, worrisome for common flexor tendon tear  Given the patient's swelling, weakness on exam, length of symptoms, and failure to improve with conservative efforts including a home exercise program and oral anti-inflammatories, I think we should get further diagnostic imaging in the form of an MRI specifically to rule out a tear of the common flexor tendon of the left elbow.  Phone follow-up with those results when available.  We will delineate further treatment based on those findings.  This note was dictated using Dragon naturally speaking software and may contain errors in syntax, spelling, or content which have not been identified prior to signing this note.

## 2018-08-05 ENCOUNTER — Telehealth: Payer: Self-pay | Admitting: Family Medicine

## 2018-08-05 NOTE — Telephone Encounter (Signed)
Patient is scheduled for MRI for her Left arm but patient believes it may be better to do MRI of her right arm, as it has been 'acting up'.  Please call to discuss.  [phone].

## 2018-08-06 ENCOUNTER — Ambulatory Visit
Admission: RE | Admit: 2018-08-06 | Discharge: 2018-08-06 | Disposition: A | Discharge status: Home / Self Care | Payer: BC Managed Care – PPO | Source: Ambulatory Visit | Attending: Sports Medicine | Admitting: Sports Medicine

## 2018-08-06 DIAGNOSIS — M25522 Pain in left elbow: Secondary | ICD-10-CM

## 2018-08-07 ENCOUNTER — Ambulatory Visit (INDEPENDENT_AMBULATORY_CARE_PROVIDER_SITE_OTHER): Payer: BC Managed Care – PPO | Admitting: Sports Medicine

## 2018-08-07 ENCOUNTER — Encounter: Payer: Self-pay | Admitting: Sports Medicine

## 2018-08-07 ENCOUNTER — Other Ambulatory Visit: Payer: Self-pay

## 2018-08-07 VITALS — BP 100/70

## 2018-08-07 DIAGNOSIS — M25521 Pain in right elbow: Secondary | ICD-10-CM | POA: Diagnosis not present

## 2018-08-07 DIAGNOSIS — M7702 Medial epicondylitis, left elbow: Secondary | ICD-10-CM

## 2018-08-07 NOTE — Progress Notes (Signed)
  Patient comes in today to discuss MRI findings of her left elbow.  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  No discrete tear is seen.  Remainder of the elbow is unremarkable.  Based on these findings, I recommend formal physical therapy coupled with a counterforce brace.  She is also having similar symptoms in her right elbow so physical therapy will be ordered for this as well.  She will be moving to [facility], [place] in about a month or so and if we need to transfer physical therapy to a place closer to [place], then I would be happy to do that.  Otherwise, follow-up with me for ongoing or recalcitrant issues.

## 2018-08-13 ENCOUNTER — Telehealth: Payer: Self-pay | Admitting: *Deleted

## 2018-08-13 DIAGNOSIS — M7712 Lateral epicondylitis, left elbow: Secondary | ICD-10-CM | POA: Diagnosis not present

## 2018-08-13 DIAGNOSIS — M7702 Medial epicondylitis, left elbow: Secondary | ICD-10-CM

## 2018-08-13 DIAGNOSIS — M7711 Lateral epicondylitis, right elbow: Secondary | ICD-10-CM | POA: Diagnosis not present

## 2018-08-13 DIAGNOSIS — M7701 Medial epicondylitis, right elbow: Secondary | ICD-10-CM | POA: Diagnosis not present

## 2018-08-13 NOTE — Telephone Encounter (Signed)
Will fax order to PT at [phone]

## 2018-08-14 DIAGNOSIS — M7701 Medial epicondylitis, right elbow: Secondary | ICD-10-CM | POA: Diagnosis not present

## 2018-08-14 DIAGNOSIS — M7711 Lateral epicondylitis, right elbow: Secondary | ICD-10-CM | POA: Diagnosis not present

## 2018-08-14 DIAGNOSIS — M7702 Medial epicondylitis, left elbow: Secondary | ICD-10-CM | POA: Diagnosis not present

## 2018-08-14 DIAGNOSIS — M7712 Lateral epicondylitis, left elbow: Secondary | ICD-10-CM | POA: Diagnosis not present

## 2018-08-19 DIAGNOSIS — M7702 Medial epicondylitis, left elbow: Secondary | ICD-10-CM | POA: Diagnosis not present

## 2018-08-19 DIAGNOSIS — M7712 Lateral epicondylitis, left elbow: Secondary | ICD-10-CM | POA: Diagnosis not present

## 2018-08-19 DIAGNOSIS — M7701 Medial epicondylitis, right elbow: Secondary | ICD-10-CM | POA: Diagnosis not present

## 2018-08-19 DIAGNOSIS — M7711 Lateral epicondylitis, right elbow: Secondary | ICD-10-CM | POA: Diagnosis not present

## 2018-08-21 DIAGNOSIS — M7701 Medial epicondylitis, right elbow: Secondary | ICD-10-CM | POA: Diagnosis not present

## 2018-08-21 DIAGNOSIS — M7702 Medial epicondylitis, left elbow: Secondary | ICD-10-CM | POA: Diagnosis not present

## 2018-08-21 DIAGNOSIS — M7711 Lateral epicondylitis, right elbow: Secondary | ICD-10-CM | POA: Diagnosis not present

## 2018-08-21 DIAGNOSIS — M7712 Lateral epicondylitis, left elbow: Secondary | ICD-10-CM | POA: Diagnosis not present

## 2018-08-28 DIAGNOSIS — M7701 Medial epicondylitis, right elbow: Secondary | ICD-10-CM | POA: Diagnosis not present

## 2018-08-28 DIAGNOSIS — M7711 Lateral epicondylitis, right elbow: Secondary | ICD-10-CM | POA: Diagnosis not present

## 2018-08-28 DIAGNOSIS — M7712 Lateral epicondylitis, left elbow: Secondary | ICD-10-CM | POA: Diagnosis not present

## 2018-08-28 DIAGNOSIS — M7702 Medial epicondylitis, left elbow: Secondary | ICD-10-CM | POA: Diagnosis not present

## 2018-09-02 DIAGNOSIS — M7711 Lateral epicondylitis, right elbow: Secondary | ICD-10-CM | POA: Diagnosis not present

## 2018-09-02 DIAGNOSIS — M7701 Medial epicondylitis, right elbow: Secondary | ICD-10-CM | POA: Diagnosis not present

## 2018-09-02 DIAGNOSIS — M7712 Lateral epicondylitis, left elbow: Secondary | ICD-10-CM | POA: Diagnosis not present

## 2018-09-02 DIAGNOSIS — M7702 Medial epicondylitis, left elbow: Secondary | ICD-10-CM | POA: Diagnosis not present

## 2018-09-03 DIAGNOSIS — M7701 Medial epicondylitis, right elbow: Secondary | ICD-10-CM | POA: Diagnosis not present

## 2018-09-03 DIAGNOSIS — M7702 Medial epicondylitis, left elbow: Secondary | ICD-10-CM | POA: Diagnosis not present

## 2018-09-03 DIAGNOSIS — M7712 Lateral epicondylitis, left elbow: Secondary | ICD-10-CM | POA: Diagnosis not present

## 2018-09-03 DIAGNOSIS — M7711 Lateral epicondylitis, right elbow: Secondary | ICD-10-CM | POA: Diagnosis not present

## 2018-09-05 ENCOUNTER — Encounter: Payer: Self-pay | Admitting: *Deleted

## 2018-09-05 ENCOUNTER — Telehealth: Payer: Self-pay | Admitting: Family Medicine

## 2018-09-05 NOTE — Telephone Encounter (Signed)
Copied from [id number]. Topic: Appointment Scheduling - Scheduling Inquiry for Clinic >> Sep 05, 2018  1:38 PM Reyne Dumas L wrote: Reason for CRM:  Pt would like to be seen today for a possible UTI, pt is having pain and trouble urinating.

## 2018-09-05 NOTE — Telephone Encounter (Signed)
It is late now. Can she bring a urine sample Monday?  Thanks, BJ

## 2018-09-05 NOTE — Telephone Encounter (Signed)
She needs to be seen,acute visit. Last visit 05/2017. Thanks, BJ

## 2018-09-05 NOTE — Telephone Encounter (Signed)
Message sent to Dr. Jordan for review. Please advise 

## 2018-09-08 ENCOUNTER — Other Ambulatory Visit: Payer: Self-pay

## 2018-09-08 ENCOUNTER — Telehealth: Payer: BC Managed Care – PPO | Admitting: Family Medicine

## 2018-09-08 NOTE — Telephone Encounter (Signed)
Patient will bring urine by the office today and is scheduled for a virtual visit.

## 2019-01-18 IMAGING — MR MR CERVICAL SPINE W/O CM
5 series · 31 of 48 positions shown · non-contrast
Comparison: Cervical spine radiographs 04/10/2017

CLINICAL DATA: Neck and upper back pain radiating to the right
shoulder for 6 months.

EXAM:
MRI CERVICAL SPINE WITHOUT CONTRAST
TECHNIQUE: Multiplanar, multisequence MR imaging of the cervical spine was
performed. No intravenous contrast was administered.

[Series 2: T2 · sagittal · 3.0mm · 0.41mm/px · 6 of 13 slices shown (1 of 2)]
[im 1/13]
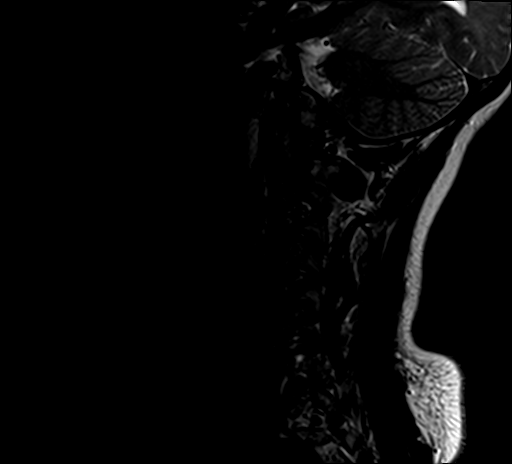
[im 3/13]
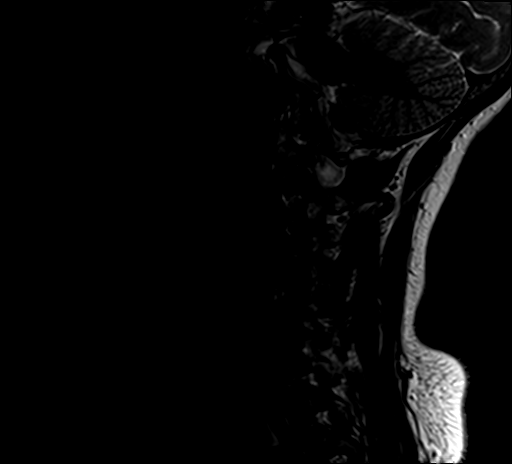
[im 5/13]
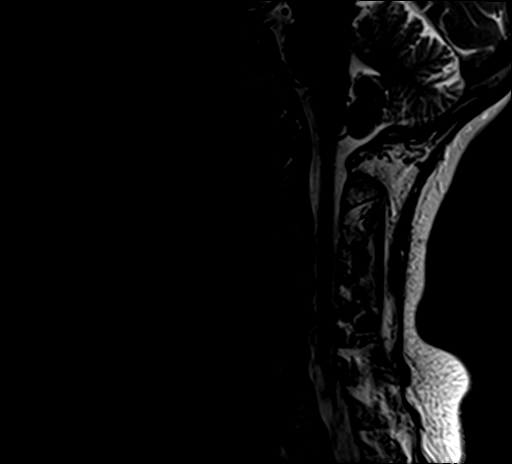
[im 8/13]
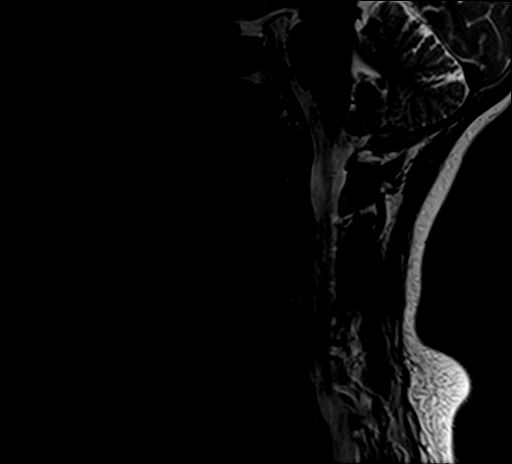
[im 10/13]
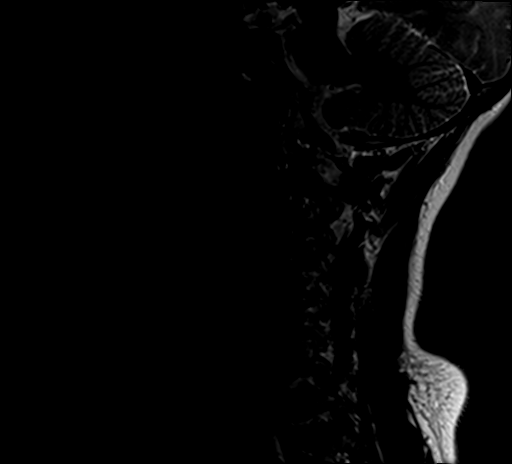
[im 13/13]
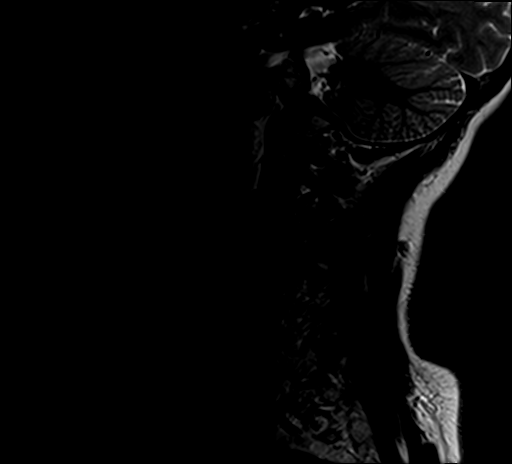

[Series 3: T1 · sagittal · 3.0mm · 0.41mm/px · 7 of 13 slices shown]
[im 1/13]
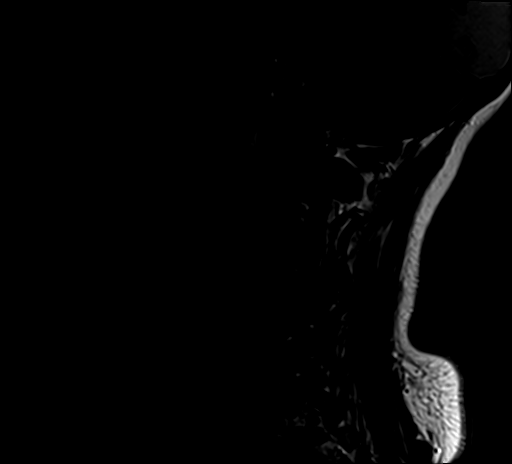
[im 3/13]
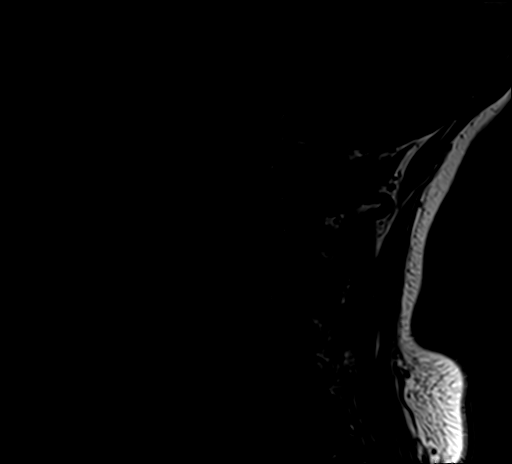
[im 5/13]
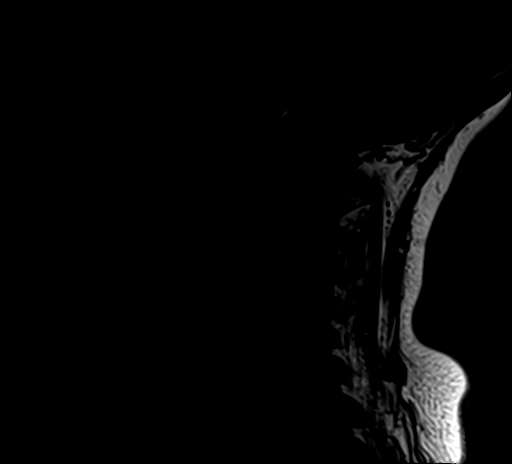
[im 7/13]
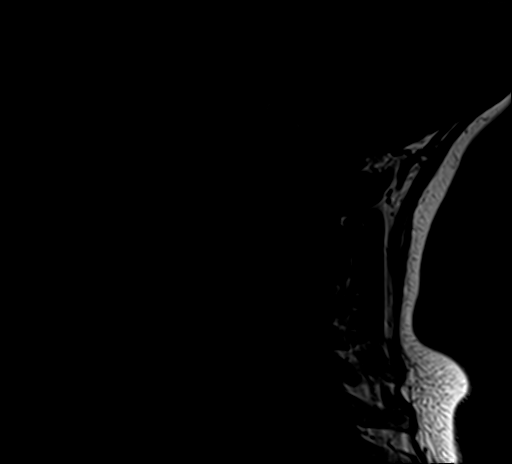
[im 9/13]
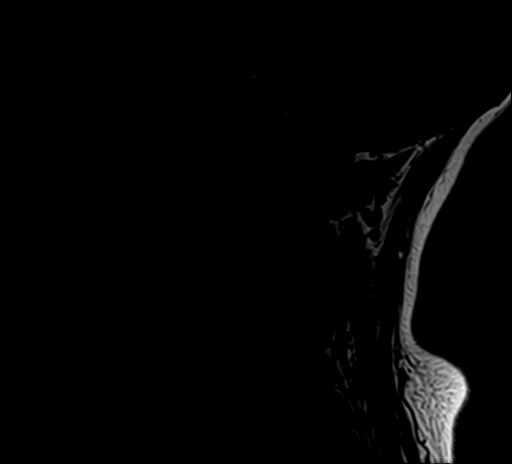
[im 11/13]
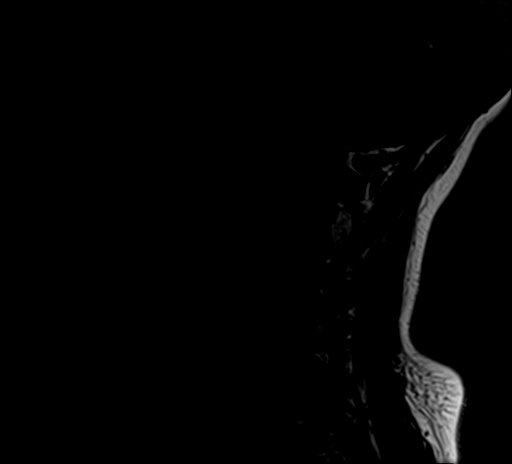
[im 13/13]
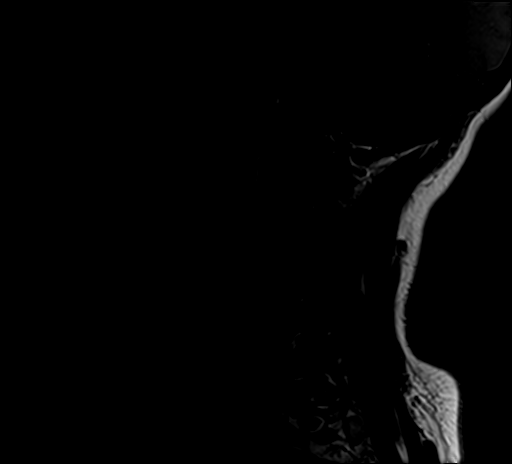

[Series 5: GRE · axial · 3.0mm · 0.35mm/px · z∈[-28,-1]mm · 3 of 26 slices shown]
[im 1/26]
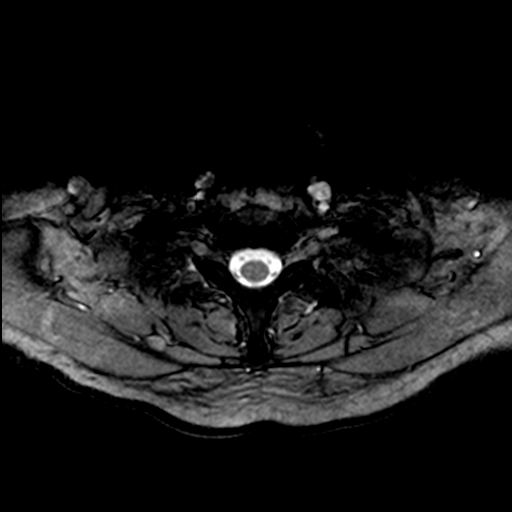
[im 4/26]
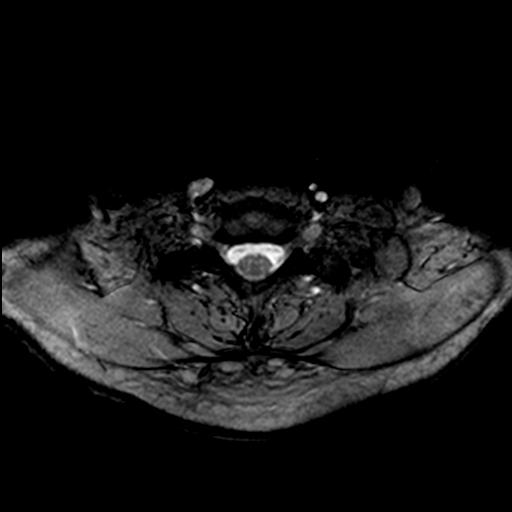
[im 8/26]
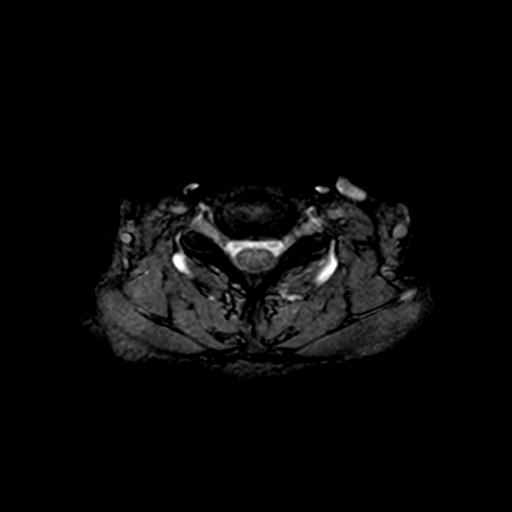

[Series 6: T2 · axial · 3.0mm · 0.70mm/px · z∈[-28,+67]mm · 8 of 26 slices shown (2 of 2)]
[im 1/26]
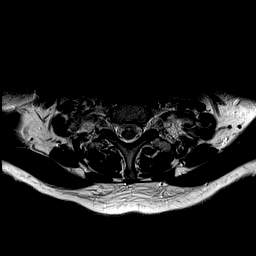
[im 4/26]
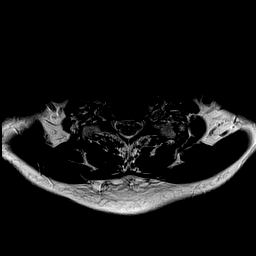
[im 8/26]
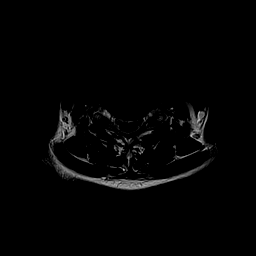
[im 12/26]
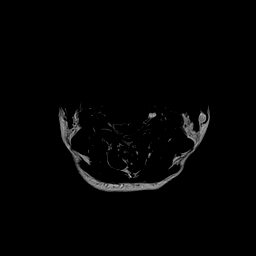
[im 14/26]
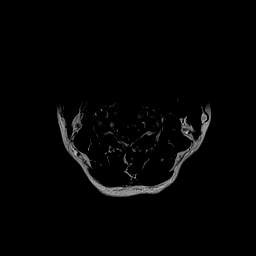
[im 18/26]
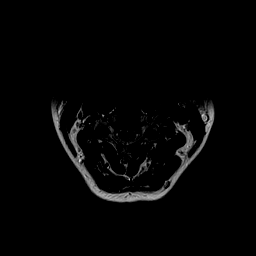
[im 22/26]
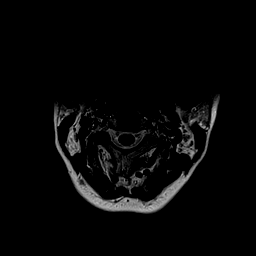
[im 26/26]
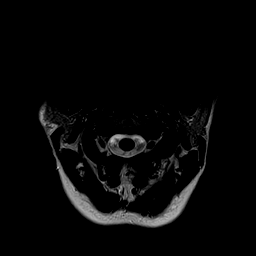

[Series 7: STIR · sagittal · 3.0mm · 0.82mm/px · 7 of 13 slices shown]
[im 1/13]
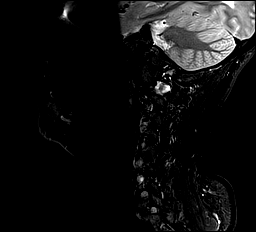
[im 3/13]
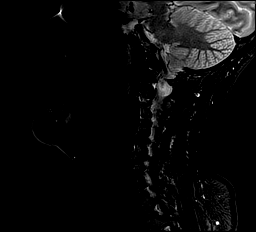
[im 5/13]
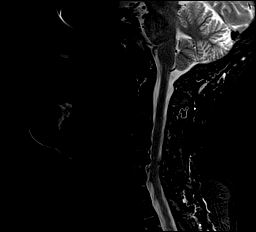
[im 7/13]
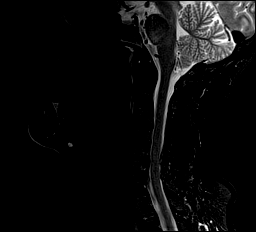
[im 9/13]
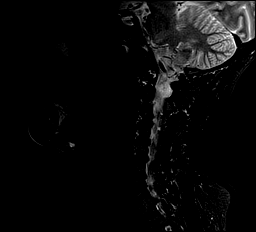
[im 11/13]
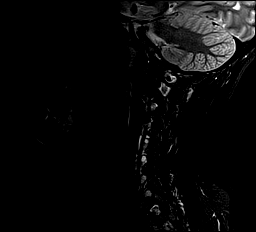
[im 13/13]
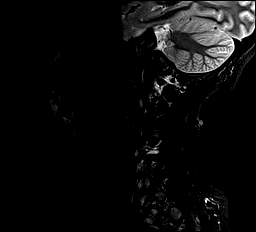

[31 of 48 positions shown; findings below may reference images not displayed]

FINDINGS: Alignment: Straightening/slight reversal of the normal cervical
lordosis. Trace retrolisthesis of C5 on C6.

Vertebrae: No fracture or suspicious osseous lesion. Mild
degenerative endplate changes at C5-6 associated with moderate disc
space narrowing. Mild disc space narrowing at C4-5.

Cord: Normal signal and morphology.

Posterior Fossa, vertebral arteries, paraspinal tissues:
Unremarkable.

Disc levels:

C2-3: Negative.

C3-4: Minimal uncovertebral spurring without stenosis.

C4-5: Right greater than left uncovertebral spurring results in mild
right neural foraminal stenosis without spinal stenosis.

C5-6: Disc bulging and right greater than left uncovertebral
spurring result in moderate right neural foraminal stenosis with
potential C6 nerve root impingement. No spinal stenosis.

C6-7: Minimal disc bulging without stenosis.

C7-T1: Negative.
IMPRESSION: Cervical disc degeneration greatest at C5-6 where there is moderate
right neural foraminal stenosis. No spinal stenosis.

## 2019-04-21 DIAGNOSIS — H00022 Hordeolum internum right lower eyelid: Secondary | ICD-10-CM | POA: Diagnosis not present

## 2019-05-13 DIAGNOSIS — Z0001 Encounter for general adult medical examination with abnormal findings: Secondary | ICD-10-CM | POA: Diagnosis not present

## 2019-05-13 DIAGNOSIS — M25532 Pain in left wrist: Secondary | ICD-10-CM | POA: Diagnosis not present

## 2019-05-13 DIAGNOSIS — R140 Abdominal distension (gaseous): Secondary | ICD-10-CM | POA: Diagnosis not present

## 2019-05-13 DIAGNOSIS — M542 Cervicalgia: Secondary | ICD-10-CM | POA: Diagnosis not present

## 2019-05-13 DIAGNOSIS — M25531 Pain in right wrist: Secondary | ICD-10-CM | POA: Diagnosis not present

## 2019-05-13 DIAGNOSIS — D229 Melanocytic nevi, unspecified: Secondary | ICD-10-CM | POA: Diagnosis not present

## 2019-05-13 DIAGNOSIS — E782 Mixed hyperlipidemia: Secondary | ICD-10-CM | POA: Diagnosis not present

## 2019-05-13 DIAGNOSIS — Z124 Encounter for screening for malignant neoplasm of cervix: Secondary | ICD-10-CM | POA: Diagnosis not present

## 2019-05-13 DIAGNOSIS — Z1159 Encounter for screening for other viral diseases: Secondary | ICD-10-CM | POA: Diagnosis not present

## 2019-05-13 DIAGNOSIS — Z1151 Encounter for screening for human papillomavirus (HPV): Secondary | ICD-10-CM | POA: Diagnosis not present

## 2019-05-13 DIAGNOSIS — Z9009 Acquired absence of other part of head and neck: Secondary | ICD-10-CM | POA: Diagnosis not present

## 2019-05-13 DIAGNOSIS — Z0000 Encounter for general adult medical examination without abnormal findings: Secondary | ICD-10-CM | POA: Diagnosis not present

## 2019-05-15 DIAGNOSIS — R140 Abdominal distension (gaseous): Secondary | ICD-10-CM | POA: Diagnosis not present

## 2019-05-25 DIAGNOSIS — Z1231 Encounter for screening mammogram for malignant neoplasm of breast: Secondary | ICD-10-CM | POA: Diagnosis not present

## 2019-08-27 DIAGNOSIS — L728 Other follicular cysts of the skin and subcutaneous tissue: Secondary | ICD-10-CM | POA: Diagnosis not present

## 2019-08-27 DIAGNOSIS — S70361A Insect bite (nonvenomous), right thigh, initial encounter: Secondary | ICD-10-CM | POA: Diagnosis not present

## 2019-08-27 DIAGNOSIS — D2339 Other benign neoplasm of skin of other parts of face: Secondary | ICD-10-CM | POA: Diagnosis not present

## 2019-08-27 DIAGNOSIS — L820 Inflamed seborrheic keratosis: Secondary | ICD-10-CM | POA: Diagnosis not present

## 2019-08-27 DIAGNOSIS — L821 Other seborrheic keratosis: Secondary | ICD-10-CM | POA: Diagnosis not present

## 2019-08-27 DIAGNOSIS — L298 Other pruritus: Secondary | ICD-10-CM | POA: Diagnosis not present

## 2019-10-06 DIAGNOSIS — R0602 Shortness of breath: Secondary | ICD-10-CM | POA: Diagnosis not present

## 2019-10-06 DIAGNOSIS — R9431 Abnormal electrocardiogram [ECG] [EKG]: Secondary | ICD-10-CM | POA: Diagnosis not present

## 2019-10-06 DIAGNOSIS — J1282 Pneumonia due to coronavirus disease 2019: Secondary | ICD-10-CM | POA: Diagnosis not present

## 2019-10-06 DIAGNOSIS — U071 COVID-19: Secondary | ICD-10-CM | POA: Diagnosis not present

## 2019-10-06 DIAGNOSIS — R110 Nausea: Secondary | ICD-10-CM | POA: Diagnosis not present

## 2019-10-07 DIAGNOSIS — J1282 Pneumonia due to coronavirus disease 2019: Secondary | ICD-10-CM | POA: Diagnosis not present

## 2019-10-07 DIAGNOSIS — U071 COVID-19: Secondary | ICD-10-CM | POA: Diagnosis not present

## 2019-10-09 DIAGNOSIS — U071 COVID-19: Secondary | ICD-10-CM | POA: Diagnosis not present

## 2019-10-09 DIAGNOSIS — R0902 Hypoxemia: Secondary | ICD-10-CM | POA: Diagnosis not present

## 2019-10-09 DIAGNOSIS — R7989 Other specified abnormal findings of blood chemistry: Secondary | ICD-10-CM | POA: Diagnosis not present

## 2019-10-09 DIAGNOSIS — J9601 Acute respiratory failure with hypoxia: Secondary | ICD-10-CM | POA: Diagnosis not present

## 2019-10-09 DIAGNOSIS — J1282 Pneumonia due to coronavirus disease 2019: Secondary | ICD-10-CM | POA: Diagnosis not present

## 2019-10-09 DIAGNOSIS — R0602 Shortness of breath: Secondary | ICD-10-CM | POA: Diagnosis not present

## 2019-10-13 DIAGNOSIS — J9601 Acute respiratory failure with hypoxia: Secondary | ICD-10-CM | POA: Diagnosis not present

## 2019-10-13 DIAGNOSIS — R7989 Other specified abnormal findings of blood chemistry: Secondary | ICD-10-CM | POA: Diagnosis not present

## 2019-10-13 DIAGNOSIS — J1282 Pneumonia due to coronavirus disease 2019: Secondary | ICD-10-CM | POA: Diagnosis not present

## 2019-10-13 DIAGNOSIS — U071 COVID-19: Secondary | ICD-10-CM | POA: Diagnosis not present

## 2019-10-26 DIAGNOSIS — U071 COVID-19: Secondary | ICD-10-CM | POA: Diagnosis not present

## 2019-10-26 DIAGNOSIS — J1282 Pneumonia due to coronavirus disease 2019: Secondary | ICD-10-CM | POA: Diagnosis not present

## 2019-10-26 DIAGNOSIS — G44209 Tension-type headache, unspecified, not intractable: Secondary | ICD-10-CM | POA: Diagnosis not present

## 2019-11-23 DIAGNOSIS — K046 Periapical abscess with sinus: Secondary | ICD-10-CM | POA: Diagnosis not present

## 2019-11-23 DIAGNOSIS — K219 Gastro-esophageal reflux disease without esophagitis: Secondary | ICD-10-CM | POA: Diagnosis not present

## 2023-02-11 ENCOUNTER — Emergency Department (HOSPITAL_BASED_OUTPATIENT_CLINIC_OR_DEPARTMENT_OTHER)
Admission: EM | Admit: 2023-02-11 | Discharge: 2023-02-12 | Discharge status: Against Medical Advice | Payer: 59 | Attending: Emergency Medicine | Admitting: Emergency Medicine

## 2023-02-11 ENCOUNTER — Other Ambulatory Visit: Payer: Self-pay

## 2023-02-11 ENCOUNTER — Encounter (HOSPITAL_BASED_OUTPATIENT_CLINIC_OR_DEPARTMENT_OTHER): Payer: Self-pay | Admitting: Emergency Medicine

## 2023-02-11 DIAGNOSIS — R002 Palpitations: Secondary | ICD-10-CM | POA: Diagnosis present

## 2023-02-11 DIAGNOSIS — Z5321 Procedure and treatment not carried out due to patient leaving prior to being seen by health care provider: Secondary | ICD-10-CM | POA: Diagnosis not present

## 2023-02-11 NOTE — ED Triage Notes (Signed)
Pt reports she was in [place] for 2.5hrs earlier today. About 30 minutes ago the feeling stopped.  She has a hx of same but does not take any medication for it.  She decided to come to the ED today b/c it lasted longer than normal. Pt is seen by a cards provider in [facility].

## 2023-02-12 LAB — BASIC METABOLIC PANEL
Anion gap: 8 (ref 5–15)
BUN: 14 mg/dL (ref 8–23)
CO2: 24 mmol/L (ref 22–32)
Calcium: 9 mg/dL (ref 8.9–10.3)
Chloride: 106 mmol/L (ref 98–111)
Creatinine, Ser: 0.57 mg/dL (ref 0.44–1.00)
GFR, Estimated: >60 mL/min (ref >60)
Glucose, Bld: 93 mg/dL (ref 70–99)
Potassium: 3.9 mmol/L (ref 3.5–5.1)
Sodium: 138 mmol/L (ref 135–145)

## 2023-02-12 LAB — CBC
HCT: 40.4 % (ref 36.0–46.0)
Hemoglobin: 13.1 g/dL (ref 12.0–15.0)
MCH: 28.1 pg (ref 26.0–34.0)
MCHC: 32.4 g/dL (ref 30.0–36.0)
MCV: 86.5 fL (ref 80.0–100.0)
Platelets: 285 10*3/uL (ref 150–400)
RBC: 4.67 MIL/uL (ref 3.87–5.11)
RDW: 13.6 % (ref 11.5–15.5)
WBC: 9.7 10*3/uL (ref 4.0–10.5)
nRBC: 0 % (ref 0.0–0.2)

## 2023-02-12 LAB — TROPONIN I (HIGH SENSITIVITY): Troponin I (High Sensitivity): 2 ng/L (ref <18)

## 2023-02-12 NOTE — ED Notes (Signed)
Pt came into triage asking if she could leave.  RN encouraged pt to stay however told her that we could not keep her here.  She stated that "episode was over and I will return should it start again." RN reviewed return precautions and pt agreed.
